# Patient Record
Sex: Female | Born: 1955 | Race: White | Hispanic: No | Marital: Married | Smoking: Never smoker
Health system: Southern US, Community
[De-identification: ages and names within clinical notes are randomized; demographics above are authoritative.]

## PROBLEM LIST (undated history)

## (undated) DIAGNOSIS — N261 Atrophy of kidney (terminal): Secondary | ICD-10-CM

## (undated) DIAGNOSIS — Z87442 Personal history of urinary calculi: Secondary | ICD-10-CM

## (undated) DIAGNOSIS — N182 Chronic kidney disease, stage 2 (mild): Secondary | ICD-10-CM

## (undated) DIAGNOSIS — E781 Pure hyperglyceridemia: Secondary | ICD-10-CM

## (undated) DIAGNOSIS — N201 Calculus of ureter: Secondary | ICD-10-CM

## (undated) DIAGNOSIS — I10 Essential (primary) hypertension: Secondary | ICD-10-CM

## (undated) DIAGNOSIS — E119 Type 2 diabetes mellitus without complications: Secondary | ICD-10-CM

## (undated) DIAGNOSIS — N281 Cyst of kidney, acquired: Secondary | ICD-10-CM

## (undated) DIAGNOSIS — Z8759 Personal history of other complications of pregnancy, childbirth and the puerperium: Secondary | ICD-10-CM

## (undated) DIAGNOSIS — N289 Disorder of kidney and ureter, unspecified: Secondary | ICD-10-CM

## (undated) DIAGNOSIS — N200 Calculus of kidney: Secondary | ICD-10-CM

## (undated) DIAGNOSIS — R3915 Urgency of urination: Secondary | ICD-10-CM

## (undated) HISTORY — DX: Personal history of urinary calculi: Z87.442

## (undated) HISTORY — DX: Essential (primary) hypertension: I10

## (undated) HISTORY — PX: CARDIAC CATHETERIZATION: SHX172

---

## 1986-04-21 DIAGNOSIS — Z8759 Personal history of other complications of pregnancy, childbirth and the puerperium: Secondary | ICD-10-CM

## 1986-04-21 HISTORY — PX: ECTOPIC PREGNANCY SURGERY: SHX613

## 1986-04-21 HISTORY — DX: Personal history of other complications of pregnancy, childbirth and the puerperium: Z87.59

## 2000-11-13 ENCOUNTER — Encounter: Admission: RE | Admit: 2000-11-13 | Discharge: 2000-11-13 | Payer: Self-pay | Admitting: Cardiology

## 2000-11-13 ENCOUNTER — Encounter: Payer: Self-pay | Admitting: Cardiology

## 2000-11-18 ENCOUNTER — Encounter: Admission: RE | Admit: 2000-11-18 | Discharge: 2000-11-18 | Payer: Self-pay | Admitting: Cardiology

## 2000-11-18 ENCOUNTER — Encounter: Payer: Self-pay | Admitting: Cardiology

## 2000-12-09 ENCOUNTER — Ambulatory Visit (HOSPITAL_COMMUNITY): Admission: RE | Admit: 2000-12-09 | Discharge: 2000-12-09 | Payer: Self-pay | Admitting: Cardiology

## 2000-12-09 ENCOUNTER — Encounter: Payer: Self-pay | Admitting: Cardiology

## 2000-12-31 ENCOUNTER — Encounter: Payer: Self-pay | Admitting: Surgery

## 2001-01-04 ENCOUNTER — Encounter: Payer: Self-pay | Admitting: Surgery

## 2001-01-04 ENCOUNTER — Ambulatory Visit (HOSPITAL_COMMUNITY): Admission: RE | Admit: 2001-01-04 | Discharge: 2001-01-05 | Payer: Self-pay | Admitting: Surgery

## 2001-01-04 ENCOUNTER — Encounter (INDEPENDENT_AMBULATORY_CARE_PROVIDER_SITE_OTHER): Payer: Self-pay | Admitting: *Deleted

## 2001-01-04 HISTORY — PX: LAPAROSCOPIC CHOLECYSTECTOMY: SUR755

## 2002-01-28 ENCOUNTER — Ambulatory Visit (HOSPITAL_COMMUNITY): Admission: RE | Admit: 2002-01-28 | Discharge: 2002-01-28 | Payer: Self-pay | Admitting: Cardiology

## 2002-01-28 ENCOUNTER — Encounter: Payer: Self-pay | Admitting: Cardiology

## 2002-02-04 ENCOUNTER — Encounter: Admission: RE | Admit: 2002-02-04 | Discharge: 2002-02-04 | Payer: Self-pay | Admitting: Cardiology

## 2002-02-04 ENCOUNTER — Encounter: Payer: Self-pay | Admitting: Cardiology

## 2002-03-29 ENCOUNTER — Ambulatory Visit (HOSPITAL_BASED_OUTPATIENT_CLINIC_OR_DEPARTMENT_OTHER): Admission: RE | Admit: 2002-03-29 | Discharge: 2002-03-29 | Payer: Self-pay | Admitting: Orthopedic Surgery

## 2002-03-29 HISTORY — PX: SHOULDER ARTHROSCOPY W/ SUBACROMIAL DECOMPRESSION AND DISTAL CLAVICLE EXCISION: SHX2401

## 2002-12-06 ENCOUNTER — Encounter: Payer: Self-pay | Admitting: Nephrology

## 2002-12-06 ENCOUNTER — Encounter: Admission: RE | Admit: 2002-12-06 | Discharge: 2002-12-06 | Payer: Self-pay | Admitting: Nephrology

## 2003-02-15 ENCOUNTER — Encounter: Admission: RE | Admit: 2003-02-15 | Discharge: 2003-02-15 | Payer: Self-pay | Admitting: Cardiology

## 2004-02-16 ENCOUNTER — Encounter: Admission: RE | Admit: 2004-02-16 | Discharge: 2004-02-16 | Payer: Self-pay | Admitting: Cardiology

## 2004-07-09 ENCOUNTER — Ambulatory Visit (HOSPITAL_COMMUNITY): Admission: RE | Admit: 2004-07-09 | Discharge: 2004-07-09 | Payer: Self-pay | Admitting: Cardiology

## 2004-08-27 ENCOUNTER — Ambulatory Visit (HOSPITAL_COMMUNITY): Admission: RE | Admit: 2004-08-27 | Discharge: 2004-08-27 | Payer: Self-pay | Admitting: Neurology

## 2005-02-17 ENCOUNTER — Encounter: Admission: RE | Admit: 2005-02-17 | Discharge: 2005-02-17 | Payer: Self-pay | Admitting: Cardiology

## 2005-04-19 ENCOUNTER — Emergency Department (HOSPITAL_COMMUNITY): Admission: EM | Admit: 2005-04-19 | Discharge: 2005-04-19 | Payer: Self-pay | Admitting: Family Medicine

## 2005-04-20 ENCOUNTER — Inpatient Hospital Stay (HOSPITAL_COMMUNITY): Admission: AD | Admit: 2005-04-20 | Discharge: 2005-04-22 | Payer: Self-pay | Admitting: Nephrology

## 2005-04-21 HISTORY — PX: OTHER SURGICAL HISTORY: SHX169

## 2005-05-19 ENCOUNTER — Ambulatory Visit (HOSPITAL_COMMUNITY): Admission: RE | Admit: 2005-05-19 | Discharge: 2005-05-19 | Payer: Self-pay | Admitting: Urology

## 2005-05-19 HISTORY — PX: EXTRACORPOREAL SHOCK WAVE LITHOTRIPSY: SHX1557

## 2005-05-23 HISTORY — PX: OTHER SURGICAL HISTORY: SHX169

## 2005-05-28 ENCOUNTER — Ambulatory Visit (HOSPITAL_BASED_OUTPATIENT_CLINIC_OR_DEPARTMENT_OTHER): Admission: RE | Admit: 2005-05-28 | Discharge: 2005-05-28 | Payer: Self-pay | Admitting: Urology

## 2006-02-02 HISTORY — PX: CARDIOVASCULAR STRESS TEST: SHX262

## 2006-02-04 ENCOUNTER — Inpatient Hospital Stay (HOSPITAL_BASED_OUTPATIENT_CLINIC_OR_DEPARTMENT_OTHER): Admission: RE | Admit: 2006-02-04 | Discharge: 2006-02-04 | Payer: Self-pay | Admitting: Cardiology

## 2006-02-20 ENCOUNTER — Encounter: Admission: RE | Admit: 2006-02-20 | Discharge: 2006-02-20 | Payer: Self-pay | Admitting: Cardiology

## 2006-05-26 ENCOUNTER — Ambulatory Visit (HOSPITAL_COMMUNITY): Admission: RE | Admit: 2006-05-26 | Discharge: 2006-05-26 | Payer: Self-pay | Admitting: Urology

## 2006-11-25 ENCOUNTER — Ambulatory Visit (HOSPITAL_COMMUNITY): Admission: RE | Admit: 2006-11-25 | Discharge: 2006-11-25 | Payer: Self-pay | Admitting: Obstetrics and Gynecology

## 2006-11-25 ENCOUNTER — Encounter (INDEPENDENT_AMBULATORY_CARE_PROVIDER_SITE_OTHER): Payer: Self-pay | Admitting: Obstetrics and Gynecology

## 2006-11-25 HISTORY — PX: HYSTEROSCOPY W/ ENDOMETRIAL ABLATION: SUR665

## 2006-12-18 ENCOUNTER — Encounter: Admission: RE | Admit: 2006-12-18 | Discharge: 2006-12-18 | Payer: Self-pay | Admitting: Gastroenterology

## 2007-01-28 ENCOUNTER — Ambulatory Visit (HOSPITAL_COMMUNITY): Admission: RE | Admit: 2007-01-28 | Discharge: 2007-01-28 | Payer: Self-pay | Admitting: Gastroenterology

## 2007-02-23 ENCOUNTER — Encounter: Admission: RE | Admit: 2007-02-23 | Discharge: 2007-02-23 | Payer: Self-pay | Admitting: Obstetrics and Gynecology

## 2008-02-24 ENCOUNTER — Encounter: Admission: RE | Admit: 2008-02-24 | Discharge: 2008-02-24 | Payer: Self-pay | Admitting: Obstetrics and Gynecology

## 2009-02-26 ENCOUNTER — Encounter: Admission: RE | Admit: 2009-02-26 | Discharge: 2009-02-26 | Payer: Self-pay | Admitting: Orthopedic Surgery

## 2010-02-27 ENCOUNTER — Encounter: Admission: RE | Admit: 2010-02-27 | Discharge: 2010-02-27 | Payer: Self-pay | Admitting: Obstetrics and Gynecology

## 2010-05-01 ENCOUNTER — Encounter: Payer: Self-pay | Admitting: *Deleted

## 2010-05-06 ENCOUNTER — Ambulatory Visit: Admission: RE | Admit: 2010-05-06 | Discharge: 2010-05-06 | Payer: Self-pay | Source: Home / Self Care

## 2010-05-06 DIAGNOSIS — I10 Essential (primary) hypertension: Secondary | ICD-10-CM | POA: Insufficient documentation

## 2010-05-06 DIAGNOSIS — E781 Pure hyperglyceridemia: Secondary | ICD-10-CM | POA: Insufficient documentation

## 2010-05-06 DIAGNOSIS — E119 Type 2 diabetes mellitus without complications: Secondary | ICD-10-CM | POA: Insufficient documentation

## 2010-05-23 NOTE — Miscellaneous (Signed)
Summary: link to wellness  Clinical Lists Changes LM on her home phone to call. needs appt with Dr. Gerilyn Pilgrim- 05/28/10 is open. will need 1 hr appt.Golden Circle RN  May 01, 2010 12:16 PM

## 2010-05-23 NOTE — Assessment & Plan Note (Signed)
Summary: new pt/pre dm/elevated A1C/also getting a lot of kidney stone...   Vital Signs:  Patient profile:   55 year old female Height:      64.5 inches Weight:      173.8 pounds BMI:     29.48  Vitals Entered By: Wyona Almas PHD (May 06, 2010 9:11 AM)  History of Present Illness: Assessment:  Spent 60 min w/ pt.  Cicely was referred by Crisoforo Oxford to Wellness.  She works 12-hr shifts at [employer], Mon, & Tue, so sleep adequacy is an issue.  She was diagnosed with pre-DM in Nov 2010, and had an A1C of 7.8 in Nov 2011.  FBG have been running 98-130s, which have improved since starting the Glimeperide in Dec.  Eating pattern is erratic.  Everyday foods/beverages include diet Mtn Dew, unswtned tea, V-8 juice.  Usual exercise routine is none.  24-hr recall suggests intake of <1000 kcal: (Up at 11:30 AM) B (1:30 PM)- 1 1/2 c spaghetti sauce w/  ~1 c noodles, unswt tea; (nap 4-5:30 PM; to work at 7 PM); Snk (7 PM)- diet Mtn Dew; L (10:30 PM)- 6" Ital sub w/ ham, salami, cheese, let, pep, oil & vinegar, chips; D (2:30 PM)- ditto sandwich, diet Mtn Dew.  Jaiya said she usually eats more veg's.  Med's include Glimeperide and Metformin, Carvedilol and Cozaar-K for HTN, and lasix prn elevated BP, and fenofibrate for elevated TG, 80 mg ASA.  Other diagnoses include hypertension (since age 33), elevated TG, and she has only one functional kidney, with a h/o multiple kidney stones.    Nutrition Diagnosis:  Physical inactivity (NB-2.1) related to poor motivation as evidenced by pt report of no exercise.  Inappropriate intake of types of carbohydrate (NI-53.3) related to veg's as evidenced by usual intake of no more than 1 x day and excessive reliance on starchy foods such as tomato sauce, corn, &  peas.    Intervention: See Patient Instructions.    Monitoring/Eval:  Dietary intake, body weight, and exercise at 3-4-wk F/U.     Other Orders: Inital Assessment Each - [facility] [id number])  Patient  Instructions: 1)  This is the time to take care of YOU.  This means figuring out a schedule of rest and sleeping that provides you with teh energy to do all that "taking care" means, including exercise, food planning, etc.   2)  Ask your doctor to check your vitamin D levels.   3)  I would like to see you focus on normalizing your blood sugars, but ALSO your blood pressure.  (Google DASH diet = lots of fruits & veg's; low- or nonfat dairy; nnuts & seeds; low in red meats and fatty foods.)  Recommended is <1500 mg Na/day.   4)  Diabetes dietary principle: Consistent & evenly distributed calories thru the day.  Suggestion: USE THE FORMS YOU MADE FOR YOUR MOM TO TRACK YOUR OWN INTAKE & TIMING OF EATING.   5)  Obtain twice as many veg's as protein or carbohydrate foods for both lunch and dinner. 6)  Limit starch foods to no more than 2 servings per meal.  ONE serving = 1 pc of bread, 1/2 cup of scoopable starch food, 1 oz of a baked item; 1 starch food = 15 g carb.  7)  Caution:  Corn is a starch, and tomato sauces can increase blood sugar quite effectively.   8)  10 minutes of any kind of exercise every day!  AND track # of minutes  you exercise on your food record.  Bring this to your follow-up appt, in 3-4 wks.  PLEASE TELL THEM LINK TO WELLNESS AS YOU SCHEDULE.     Orders Added: 1)  Inital Assessment Each - [facility] [[id number]]

## 2010-06-06 ENCOUNTER — Ambulatory Visit (INDEPENDENT_AMBULATORY_CARE_PROVIDER_SITE_OTHER): Payer: Commercial Managed Care - PPO | Admitting: Family Medicine

## 2010-06-06 DIAGNOSIS — E119 Type 2 diabetes mellitus without complications: Secondary | ICD-10-CM

## 2010-06-06 NOTE — Patient Instructions (Addendum)
-   Daily food record, including what, how much, and what time food is eaten.   - When you get a very low or very high BG reading, look back to what you last ate and how much, as well as when you last ate.  (Write it down.)   - When you feel hungry, EAT.  Bring snacks to work, such as string cheese, fruit, yogurt, protein bar.  Pay attention to your hunger.   - Include a substantial amount of protein at each meal, and when possible, include some protein with snacks.   - Don't be afraid of all carb's, but be selective, i.e., choose carb's with fiber or with natural sources of sugar.  Remember that you NEED some carb in the morning after fasting.   - Work on incorporating some walking to your routine.  Goal:  At least 15 min 2 X day, 5 X wk.  Write # of minutes on your food record.   - Record BG on food records as well.   - Please schedule a follow-up nutrition appt for about a month from now.

## 2010-06-06 NOTE — Progress Notes (Signed)
Medical Nutrition Therapy:  Appt start time: 0830 end time:  0900.  Assessment:  Primary concerns today: Weight management and Blood sugar control. Madison Gill works third shift.  She said she did well bringing food to work or was eating salads at the cafeteria at [facility].  FBG have been 80-90, and other BG have been lower as well.  Did not do a 24-hr recall b/c yesterday was atypical, although Madison Gill said her eating pattern is erratic, as she's been very busy (as usual).   She has been walking at least 15 min 2-4 X day b/c of parking far away at work.  She still hopes to get a routine for walking at home as well.   Usual eating pattern has been erratic, although less so when she was doing day shift for a couple wks recently. Madison Gill is back to her usual third shift now.    Progress Towards Goal(s):  In progress   Nutritional Diagnosis:  NB-2.1 Physical inactivity As related to time constraints.  As evidenced by no dedicated exercise time. NI-5.8.3 Inappropriate intake of types of carbohydrates (specify):  As related to vegetables.  As evidenced by self-report of some, but not frequent intake of veg's.    Intervention:  Nutrition education.   Monitoring/Evaluation:  Dietary intake in 1 month.

## 2010-07-04 ENCOUNTER — Ambulatory Visit (INDEPENDENT_AMBULATORY_CARE_PROVIDER_SITE_OTHER): Payer: Commercial Managed Care - PPO | Admitting: Family Medicine

## 2010-07-04 DIAGNOSIS — E119 Type 2 diabetes mellitus without complications: Secondary | ICD-10-CM

## 2010-07-04 NOTE — Progress Notes (Signed)
Medical Nutrition Therapy:  Appt start time: 0900 end time:  0930.  Assessment:  Primary concerns today: Blood sugar control. Madison Gill has been making better food choices, although she has not been able to work exercise into her routine yet.  Recent A1C was much improved, 6.3.  Madison Gill's sleep has not improved; third shift work hours in addition to time commitments (and worry) related to care of her mother make this difficult.  She is eating more often, including 3 meals and at least 2 snacks daily.  She has also been keeping a food record, has been bringing veg's to work, and has consistently been getting some protein with meals.  FBGs have been 80-115.  No 24-hr recall today b/c yesterday was an atypical day (catching up on sleep).   Progress Towards Goal(s):  Some progress.   Nutritional Diagnosis:  NB-2.1 Physical inactivity As related to time constraints.  As evidenced by no dedicated exercise time. Much improved on inappropriate carbohydrate intake as evidenced by A1C of 6.3.      Intervention:  Nutrition education.   Monitoring/Evaluation:  Dietary intake in 1 month.

## 2010-07-04 NOTE — Patient Instructions (Signed)
-   Keep up the great job you've been doing with food choices.   - Physical activity goals:  1. Track # minutes you do ANY physical activity (includes gardening, significant housework).  2. Walk at least 5 minutes daily.   3. Exercise opportunities:  Never lie down when you can sit; never sit when you can stand; never      stand when you can pace.  Also includes parking far away; stairs vs elevator.   - SLEEP!  Do your best to get more.

## 2010-08-05 ENCOUNTER — Ambulatory Visit: Payer: Commercial Managed Care - PPO | Admitting: Family Medicine

## 2010-08-12 ENCOUNTER — Ambulatory Visit (INDEPENDENT_AMBULATORY_CARE_PROVIDER_SITE_OTHER): Payer: 59 | Admitting: Family Medicine

## 2010-08-12 DIAGNOSIS — E119 Type 2 diabetes mellitus without complications: Secondary | ICD-10-CM

## 2010-08-12 NOTE — Patient Instructions (Addendum)
-   Continue keeping food record and fasting BG, but add times for eating and BG checks.   - One goal is to have pretty evenly distributed calories thru the day.   - Aim for vegetables twice a day.  When you have pizza, ALWAYS include a salad (or any other veg).  If no veg, then have a fruit.   - Try to increase your physical activity, and continue to record exercise in your notebook.

## 2010-08-12 NOTE — Progress Notes (Signed)
Medical Nutrition Therapy:  Appt start time: 0900 end time:  0930.  Assessment:  Primary concerns today: Blood sugar control. Madison Gill's food record suggests limited kcal intake and careful CHO control on most days.  Some meals do not include veg's, although she has been eating a lot of bean & veg soup she has been making.  She had one hypoglycemic event on Mar 17, after which she called Dr. Doristine Counter, who said to discontinue PM Amaryl dose.  FBG went up initially after this, but most are back in the 100s range or lower.  30-day average is 111.  She has played doubles  tennis twice and has been walking 10-20 min 4-5 X wk.  It is surprising that Madison Gill's wt is up today (6 lb) in light of her food record and at least minimal exercise.    Progress Towards Goal(s):  Some progress.   Nutritional Diagnosis:  NB-2.1 Physical inactivity As related to time constraints.  As evidenced by 10-20 min walking 4-5 X wk. Much improved on inappropriate carbohydrate intake as evidenced by A1C of 6.3.      Intervention:  Nutrition education.   Monitoring/Evaluation:  Dietary intake in 1 month.

## 2010-09-03 NOTE — Op Note (Signed)
NAMEADDILYN, Madison Gill                  ACCOUNT NO.:  [id number]   MEDICAL RECORD NO.:  [id number]          PATIENT TYPE:  AMB   LOCATION:  SDC                           FACILITY:  [facility]   PHYSICIAN:  Malva Limes, M.D.    DATE OF BIRTH:  07-03-1955   DATE OF PROCEDURE:  11/25/2006  DATE OF DISCHARGE:                               OPERATIVE REPORT   PREOPERATIVE DIAGNOSIS:  Menorrhagia.   POSTOPERATIVE DIAGNOSIS:  Menorrhagia.   PROCEDURES:  1. Dilation and curettage.  2. NovaSure endometrial ablation.   SURGEON:  Malva Limes, M.D.   ANESTHESIA:  General.   ANTIBIOTICS:  Ancef 1 g.   DRAINS:  Catheter to the bladder.   ESTIMATED BLOOD LOSS:  Minimal.   SPECIMENS:  Endometrial curettings to pathology.   COMPLICATIONS:  None.   PROCEDURE:  The patient was taken to the operating room, where a general  anesthetic was administered without complications.  She was then placed  in the dorsal lithotomy position.  She was prepped with Betadine and  draped in the usual fashion for this procedure.  An exam under  anesthesia revealed an anteverted uterus of normal size and shape. A  weighted speculum was placed in the vagina, 20 mL of 1% lidocaine were  used for a paracervical block.  A single-tooth tenaculum was applied to  the anterior cervical lip.  The uterus was sounded to 10 cm.  The cervix  was serially dilated to a 31 [place].  Sharp curettage was performed.  Next the cervical length was measured to 4 cm, giving an intracavitary  length of 6 cm.  The NovaSure device was advanced into the fundus of the  uterus and opened to a width of 4 cm.  At this point the NovaSure  ablation was performed without difficulty.  The patient was then  extubated and taken to the recovery room in stable condition.  She will  be discharged to home with Percocet to take p.r.n.  She will follow up  in the office in 4 weeks.           ______________________________  Malva Limes, M.D.    MA/MEDQ   D:  11/25/2006  T:  11/25/2006  Job:  [id number]

## 2010-09-03 NOTE — Op Note (Signed)
NAME:  Madison Gill, Madison Gill                  ACCOUNT NO.:  [id number]   MEDICAL RECORD NO.:  [id number]          PATIENT TYPE:  AMB   LOCATION:  ENDO                         [facility]ACILITY:  [facility]   PHYSICIAN:  James L. Malon Kindle., M.D.DATE OF BIRTH:  05/31/1955   DATE OF PROCEDURE:  01/28/2007  DATE OF DISCHARGE:                               OPERATIVE REPORT   PROCEDURE:  Colonoscopy.   MEDICATIONS:  Fentanyl 62.5 mcg, Versed 7 mg IV.   SCOPE:  Pentax adult scope.   INDICATIONS:  Colon cancer screening in a woman whose father had colon  polyps.   DESCRIPTION OF PROCEDURE:  Procedure explained to the patient, consent  obtained.  With the patient in left lateral decubitus position, the  scope was inserted and advanced __________  were able to reach the cecum  without difficulty.  Ileocecal valve and appendiceal orifice seen.  Scope withdrawn.  Cecum, ascending, transverse, descending and sigmoid  colon seen well.  No polyps.  Rectum free of polyps.  The scope was  withdrawn.  The patient tolerated the procedure well.   ASSESSMENT:  Family history of colon polyps in her father with no  abnormalities found on colonoscopy.   PLAN:  Routine post-colonoscopy instructions.  Will recommend repeating  her colonoscopy in 5 years.           ______________________________  Llana Aliment Malon Kindle., M.D.     Waldron Session  D:  01/28/2007  T:  01/29/2007  Job:  [id number]   cc:   Cassell Clement, M.D.  Fax: [phone]   Dyke Maes, M.D.  Fax: [phone]   Llana Aliment. Malon Kindle., M.D.  Fax: [phone]

## 2010-09-06 NOTE — Discharge Summary (Signed)
Madison Gill, Madison Gill                  ACCOUNT NO.:  [id number]   MEDICAL RECORD NO.:  [id number]          PATIENT TYPE:  INP   LOCATION:  5709                         FACILITY:  [facility]   PHYSICIAN:  Aram Beecham B. Eliott Nine, M.D.DATE OF BIRTH:  1955/11/16   DATE OF ADMISSION:  04/20/2005  DATE OF DISCHARGE:  04/22/2005                                 DISCHARGE SUMMARY   ADMISSION DIAGNOSIS:  Acute renal failure secondary to obstruction of a  solitary functioning kidney.   DISCHARGE DIAGNOSES:  1.  Obstructive uropathy with acute renal failure, status post laser      ablation and double-J stent placement.  2.  Anemia.  3.  Hypertension.   DISCHARGE MEDICATIONS:  1.  Coreg 25 mg b.i.d.  2.  HCTZ 12.5 mg daily.   PROCEDURE:  April 21, 2005 - Cystoscopy with left retrograde pyelogram,  ureteroscopy, Holmium laser of a left ureteropelvic junction stone and  insertion of a double-J catheter, done at [facility]  under general anesthesia.   HISTORY OF PRESENT ILLNESS:  Ms. 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 She had a prior history of parenchymal kidney stones  by prior MRI and CT of the abdomen but had never been symptomatic from  stones. She presented to the emergency department with 3 days of left flank  pain and inability to void. On admission to the hospital, serum creatinine  was 2.3 and CT scan showed left sided hydronephrosis with a ureterovesical  stone and a 5 mm stone at the ureterovesical junction. There were other  intra-renal stones as well.   PAST MEDICAL HISTORY/SOCIAL HISTORY/ALLERGIES/REVIEW OF SYSTEMS:  Are as in  the dictated H&P.   PHYSICAL EXAMINATION:  On admission was notable for a blood pressure of  139/88, left CVA tenderness and a serum creatinine of 2.3.   LABORATORY DATA:  CT scan is as noted above.   HOSPITAL COURSE:  Because of the acute  hydronephrosis and a solitary  functioning kidney, urology was consulted. Dr. Brunilda Payor consulted on the patient  and took her to [facility] on April 21, 2005, where a  left retrograde pyelogram, ureteroscopy, and Holmium laser was done for the  left UPJ stone. A double-J catheter was inserted. She immediately had 700 cc  of bloody urine output. She continued to make good urine. Her creatinine  peaked at 5.7 prior to the procedure, was down to 3.6 the afternoon  afterward, and was 2.1 on the day of discharge. She also experienced a drop  in hemoglobin, most likely related to the procedure and ongoing hematuria.  Hemoglobin dropped from 13.6 on admission to 10.3 on the day of discharge.   She demonstrated marked clinical improvement, excellent urine output, no  continued blood urine. There were no complications relevant to the  procedure.   DISPOSITION:  The patient will be discharged home on April 22, 2005.   SPECIAL INSTRUCTIONS:  She will continue Coreg and HCTZ but Diovan, which  was held during her hospitalization will remain on hold.   FOLLOW UP:  She will return to [facility] on April 24, 2005 for a  blood pressure check and followup labs to include a CBC and renal profile.  Weight and blood pressure will also be done at that time and she will  receive a followup appointment to see Dr. Briant Cedar and instructions on  whether or not to restart her Diovan. Dr. Brunilda Payor will contact her with a  followup appointment to pull the double-J stent in approximately 1 month and  he plans to do a stone evaluation at that time as well. She will be  instructed to start oral iron for her anemia.   DISCHARGE MEDICATIONS:  1.  Coreg 25 mg b.i.d.  2.  HCTZ 12.5 mg daily.  3.  Ferrous sulfate 325 mg t.i.d.  4.  Diovan will remain on HOLD, pending further instructions.           ______________________________  Duke Salvia Eliott Nine, M.D.     CBD/MEDQ  D:  04/22/2005  T:   04/22/2005  Job:  [id number]   cc:   Lindaann Slough, M.D.  Fax: [phone]   Dyke Maes, M.D.  Fax: [phone]   [facility]

## 2010-09-06 NOTE — H&P (Signed)
Madison Gill, Madison Gill                  ACCOUNT NO.:  [id number]   MEDICAL RECORD NO.:  [id number]          PATIENT TYPE:  OUT   LOCATION:  DAY                          FACILITY:  [facility]   PHYSICIAN:  Maree Krabbe, M.D.DATE OF BIRTH:  Mar 20, 1956   DATE OF ADMISSION:  04/21/2005  DATE OF DISCHARGE:                                HISTORY & PHYSICAL   REASON FOR ADMISSION:  Elevated creatinine and no urine output.   HISTORY:  This is a 55 year old female with history of hypertension followed  by Dr. Briant Cedar on three blood pressure medications.  She has no renal  disease, except she does have a solitary renal function and a left to non-  functioning right kidney.  She also has a history of intrarenal stones by  previous MRI and CT of the abdomen.  She has never had a symptomatic  ureteral stone.  Over the last three days, she has developed left flank pain  and inability to void.  She presented to the emergency room last night where  we drew some laboratories, gave her fluids and pain medicines.  She had pain  relief, but the creatinine returned to 2.3 and I am admitting her today to  look for hydronephrosis because she still has not voided.  The CT scan we  just did as part of her admission work-up shows a left-sided hydronephrotic  kidney with a ureteral vesicular stone and a 5 mm stone at the  ureterovesical junction and some intrarenal stones also.   PAST MEDICAL HISTORY:  1.  Hypertension.  2.  Cholecystectomy.  3.  Ectopic pregnancy.  4.  Shoulder surgery.  5.  Cesarean section.   HOME MEDICATIONS:  1.  Coreg 25 b.i.d.  2.  HCTZ 12.5 daily.  3.  Diovan 320 b.i.d.   SOCIAL HISTORY:  No tobacco or alcohol.  She is married, lives with her  husband.  Two children 46 and 55 year old.  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   ALLERGIES:  None.   REVIEW OF SYSTEMS:  No fevers, chills, sweats.  ENT:  No hearing loss,  visual change.  CARDIORESPIRATORY:  No cough,  shortness of breath, chest  pain.  GI:  Positive for nausea, vomiting, left flank pain.  No diarrhea.  GU:  She had a small amount of gross hematuria, but otherwise had not voided  in three days.  MUSCULOSKELETAL:  No myalgia, arthralgia.  No focal  numbness, weakness.   PHYSICAL EXAMINATION:  VITAL SIGNS:  139/88 blood pressure, heart rate 80,  respirations 12.  Patient afebrile.  SKIN:  Warm and dry.  HEENT:  PERRL.  EOMI.  Throat clear.  NECK:  Supple without JVD.  CHEST:  Clear throughout.  CARDIAC:  Regular rate and rhythm without murmurs, rubs, or gallops.  ABDOMEN:  Soft, nontender.  No CVA tenderness today.  In the emergency room  last night she had marked left CVA tenderness.  EXTREMITIES:  No peripheral edema.  Good peripheral pulses.  NEUROLOGIC:  Nonfocal motor examination.  Alert and oriented x3 in  no  distress.   LABORATORIES:  Creatinine 2.3 from laboratories last night.  CT scan results  as above.   IMPRESSION:  1.  Acute renal failure in a patient with a solitary left kidney and normal      baseline function according to her currently with symptoms of left      ureterolithiasis and a history of known renal stones.  CT scan does show      an obstructing stone with left-sided hydronephrosis and we will need      urology consults.  Will decrease intravenous fluids to avoid fluid      overload.  Patient will probably need a stent.  I have not started      antibiotics.  This might be something to consider also.  She has not had      any fever.  There is no urine on I&O catheterization.  2.  Hypertension, long-standing.  Will hold Diovan, hydrochlorothiazide.      Continue Coreg for now.      Maree Krabbe, M.D.  Electronically Signed     RDS/MEDQ  D:  04/20/2005  T:  04/21/2005  Job:  [id number]

## 2010-09-06 NOTE — Op Note (Signed)
[place]. [facility]  Patient:    Madison Gill, Madison Gill. Visit Number: [id number] MRN: [id number]          Service Type: Attending:  Sandria Bales. Ezzard Gill, M.D. Dictated by:   Madison Gill, M.D. Proc. Date: 01/04/01   CC:         Madison Gill, M.D.   Operative Report  DATE OF BIRTH:  04-Apr-1956.  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  PROCEDURE:  Laparoscopic cholecystectomy with intraoperative cholangiogram.  SURGEON:  Madison Gill, M.D.  FIRST ASSISTANT:  Madison Gill, M.D.  ANESTHESIA:  General endotracheal.  ESTIMATED BLOOD LOSS:  Minimal.  INDICATION FOR PROCEDURE:  Madison Gill is a 55 year old white female who comes with symptomatic cholelithiasis.  She also by CT scan has a mesenteric cyst on the greater curvature of the stomach that I will try to identify at the time of the procedure.  The patient has a nonfunctioning right kidney.  She has been evaluated and is hypertensive.  Her primary doctor is Dr. Cassell Gill.  DESCRIPTION OF PROCEDURE:  Patient placed in a supine position.  She was given 1 g of Ancef at the initiation of the procedure.  She underwent a general endotracheal anesthetic.  Her abdomen was prepped with Betadine solution.  She had PAS stockings in place.  An infraumbilical incision was made, with sharp dissection carried down to the abdominal cavity.  A 0 degree, 10 mm laparoscope was inserted through a 12 mm Hasson trocar.  The Hasson trocar was secured with a 0 Vicryl suture and the abdomen insufflated with CO2 to about 4 L.  Both lobes of the liver were evaluated.  There was no mass or nodule.  There was no obvious mass along the greater curvature of the  stomach.  She had some adhesions in the lower abdomen from a prior C-section.  Three different trocars were placed, a 10 mm Ethicon trocar in the subxiphoid location, a 5 mm Ethicon trocar in the right subcostal location, and a 5 mm Ethicon trocar in the right lateral subcostal location.  Each trocar site was infiltrated with 0.25% Marcaine.  The gallbladder was grasped, rotated cephalad.  The gallbladder was noted to be deeply invested in fibrous scar tissue, which actually tethered the duodenum against the gallbladder wall.  This was taken down both with blunt dissection and sharp dissection using hook Bovie coagulation.  An anterior and posterior branch of the cystic artery were identified.  The anterior branch was triply endoclipped and the posterior branch doubly endoclipped.  The cystic duct was identified and was somewhat kinked but then straightened out, and an intraoperative cholangiogram was obtained.  A clip was placed on the gallbladder side of the cystic duct, an incision made in the side of the cystic duct.  The intraoperative cholangiogram was obtained using a cut-off Taut catheter inserted through a 14-gauge Jelco catheter into the abdominal cavity.  The cystic duct was cut.  There were at least three impacted gallstones, which were milked back.  This then did allow free flow of bile out of the cystic duct.  A catheter was then placed into the cystic duct and intraoperative cholangiogram then obtained using half-strength Hypaque solution, using about 8 cc under direct fluoroscopy, showing free flow of contrast down the  cystic duct to the common bile duct, which was small, and into the duodenum.  There was no obstruction, no mass, no filling defect, and this was felt to be a normal intraoperative cholangiogram.  The Taut catheter was then removed, the cystic duct triply endoclipped and divided.  The gallbladder was then sharply and bluntly dissected, divided from the  gallbladder bed using primarily hook Bovie coagulation.  Prior to complete division of the gallbladder from the gallbladder bed, the gallbladder bed was visualized, and there was no bleeding.  The triangle of Calot was visualized, and there was no bleeding or bile leak from this.  The gallbladder was then divided, placed in an Endocatch bag, and delivered through the umbilicus.  I then irrigated out the abdomen with about a liter of saline.  I then looked for this mesenteric cyst along the greater curvature of the stomach.  Using a [place] and [place] and [place], I was able to walk along the edge of the greater curvature all the way up to the spleen and could see no obvious mesenteric cyst, mass, or nodularity.  There was no other suspicious lesion identified.  I thought I got a pretty good view of the greater curvature of the stomach, though the _____ could have been hidden in the fat.  I was somewhat leery about trying to pull the stomach down too much for tearing the spleen, and I was not aggressive at the upper end of the stomach to look for this.  Therefore, I stopped the procedure at this time with plan for repeat CT scan in six months to make sure this cyst is stable as originally recommended.  Each trocar was removed under direct visualization. The umbilical trocar site was closed with a 0 Vicryl suture.  The other trocar sites were just irrigated, skin closed at each trocar site with a 5-0 Vicryl suture, painted with tincture of benzoin and Steri-Strips and sterilely dressed.  The patient tolerated the procedure well, was transported to the recovery room in good condition.  Sponge and needle count were correct at the end of the case. Dictated by:   Madison Gill, M.D. Attending:  Sandria Bales. Ezzard Gill, M.D. DD:  01/04/01 TD:  01/04/01 Job: [id number]

## 2010-09-06 NOTE — H&P (Signed)
NAME:  Madison Gill, Madison Gill                  ACCOUNT NO.:  [id number]   MEDICAL RECORD NO.:  [id number]          PATIENT TYPE:  OIB   LOCATION:  NA                           FACILITY:  [facility]   PHYSICIAN:  Colleen Can. Deborah Chalk, M.D.DATE OF BIRTH:  06-06-1955   DATE OF ADMISSION:  DATE OF DISCHARGE:                                HISTORY & PHYSICAL   CHIEF COMPLAINT:  Chest pain.   HISTORY OF PRESENT ILLNESS:  The patient is a very pleasant 55 year old  white female who is referred for elective diagnostic cardiac  catheterization.  She was seen in the office as a work-in appointment on  October 10 with multiple somatic complaints.  She has had chest pain and  significant fatigue and basically had not felt well over the course of the  previous weekend.  She describes an indigestion and burning-like sensation  that goes up into her chest.  She really has not wanted to eat and she is  very tired.  She has not been sleeping.  Her blood pressure was noted to be  grossly elevated.  She denied any significant stress and actually thought  that she had been going through menopause.  Her menstrual cycle had been  very heavy with significant menorrhagia.  She was subsequently referred for  stress Cardiolite study which was performed by Dr. Patty Sermons on October 15.  With this, she exercised well on a standard Bruce protocol.  She did not  have chest pain.  There were no EKG changes; however, she was noted to have  a possible reversible anterior defect with ischemia versus breast  attenuation. She is now referred for diagnostic catheterization.   PAST MEDICAL HISTORY:  1. Hypertension, recently uncontrolled.  2. Atypical chest pain.  3. History of only 1 functioning kidney followed by Dr. Fidela Salisbury.  4. Questionable menopausal symptoms.  5. History of depression.  6. Previous cholecystectomy in 2002.  7. History of nephrolithiasis.  8. Known history of diffuse right renal artery stenosis in the  setting of      a small right kidney with possible history of polycystic kidney      disease.  9. Past history of tachycardia.  10.ACE-related cough.  11.Prior history of UTIs.   ALLERGIES:  1. ALTACE WHICH CAUSES A COUGH.  2. TOPROL RESULTS IN FEELING OF FATIGUE.   CURRENT MEDICATIONS:  1. Micardis HCT 80/12.5 daily.  2. Wellbutrin 300 mg a day.  3. Coreg 12.5 mg 2 tablets in the morning and 1 at bedtime.  4. Norvasc 5 mg a day.  5. Baby aspirin daily.  6. Nitroglycerin p.r.n.  7. Prevacid 30 mg a day.   FAMILY HISTORY:  Positive for hypertension and diabetes.   SOCIAL HISTORY:  She is married.  She has no alcohol or tobacco use.  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   REVIEW OF SYSTEMS:  Basically as noted above.  She continues to have chest  pain off and on.  It is really nonexertional in nature.  It is described  as  a dully, achy like sensation.  She has had associated headache and nausea.  She continues to have trouble sleeping.   PHYSICAL EXAMINATION:  VITAL SIGNS:  Her weight is 179, blood pressure is  140/80 sitting and 120/80 standing, heart rate is 80 and regular,  respirations 18.  She is afebrile.  SKIN:  Warm and dry.  Color is unremarkable.  LUNGS:  Clear.  HEART:  Regular rhythm.  ABDOMEN:  Soft, positive bowel sounds, nontender.  EXTREMITIES:  Without edema.  NEUROLOGIC:  No gross focal deficits.   PERTINENT LABS:  Pending.   OVERALL IMPRESSION:  1. Chest pain in the setting of an abnormal stress Cardiolite study.  2. Hypertension.  3. History of diffuse right renal artery stenosis with a small atrophic      right kidney and possible polycystic kidney disease.   PLAN:  Will proceed on with diagnostic catheterization.  Procedure has been  reviewed in full detail and she is willing to proceed on Wednesday, February 04, 2006.      Sharlee Blew, N.P.      Colleen Can. Deborah Chalk, M.D.  Electronically  Signed    LC/MEDQ  D:  02/03/2006  T:  02/04/2006  Job:  [id number]   cc:   Dyke Maes, M.D.

## 2010-09-06 NOTE — Op Note (Signed)
NAMEVENBA, ZENNER                  ACCOUNT NO.:  [id number]   MEDICAL RECORD NO.:  [id number]          PATIENT TYPE:  AMB   LOCATION:  [facility]                         FACILITY:  [facility]   PHYSICIAN:  Lindaann Slough, M.D.  DATE OF BIRTH:  [birth year]   DATE OF PROCEDURE:  05/28/2005  DATE OF DISCHARGE:                                 OPERATIVE REPORT   PREOPERATIVE DIAGNOSIS:  Left ureteral stones.   POSTOPERATIVE DIAGNOSIS:  Left ureteral stones.   PROCEDURE:  Cystoscopy, ureteroscopy with stone extraction and insertion of  double-J catheter.   SURGEON:  Danae Chen, M.D.   ANESTHESIA:  General.   INDICATIONS:  The patient is a 55 years old female who had holmium laser of  left UPJ stone on April 21, 2005 then ESL of left renal stone on May 19, 2005. The double-J catheter was removed 2 days ago. She is known to have  an atrophic right kidney. She was doing well until last night when she  started having severe left flank pain. She went to see Dr. Briant Cedar today.  Her creatinine went up to 2.3. Dr. Briant Cedar called me and I asked him to  send her to the office. KUB showed multiple stone fragments in the left  distal ureter. She is scheduled for cystoscopy and stone extraction.   Under general anesthesia, the patient was prepped and draped and placed in  the dorsolithotomy position. A #22 Wappler cystoscope was inserted in the  bladder and a sensor guidewire was passed through the cystoscope into the  left ureter all the way up into the renal pelvis. Then a #6.5 [place] short  ureteroscope was passed in the bladder and into the ureter. There are  multiple stone fragments  in the distal ureter. Those stone fragments were  broken down in smaller fragments and with the nitinol basket those stone  fragments were extracted. There was no evidence of ureteral injury and there  was no evidence of remaining stone fragments in the ureter. The ureteroscope  was advanced without difficulty  up to the upper ureter. The ureteroscope was  then removed. The guidewire was then backloaded into the cystoscope and a #6-  [place] - 24 Polaris double-J catheter was passed over the guidewire. The  proximal curl of the double-J catheter is in the renal pelvis and the loops  are in the bladder. The bladder was then irrigated and all stone fragments  were irrigated out of the bladder. The bladder was then emptied and the  cystoscope and guidewire were removed.   The patient tolerated the procedure well and left the OR in satisfactory  condition to post anesthesia care unit.      Lindaann Slough, M.D.  Electronically Signed     MN/MEDQ  D:  05/28/2005  T:  05/29/2005  Job:  [id number]

## 2010-09-06 NOTE — Op Note (Signed)
NAME:  Madison Gill, Madison Gill                            ACCOUNT NO.:  [id number]   MEDICAL RECORD NO.:  [id number]                   PATIENT TYPE:  AMB   LOCATION:  [facility]SC                                  FACILITY:  [facility]   PHYSICIAN:  Vania Rea. Supple, M.D.               DATE OF BIRTH:  Oct 30, 1955   DATE OF PROCEDURE:  03/29/2002  DATE OF DISCHARGE:                                 OPERATIVE REPORT   PREOPERATIVE DIAGNOSIS:  1. Left shoulder impingement syndrome.  2. Left shoulder acromioclavicular joint arthropathy.   POSTOPERATIVE DIAGNOSIS:  1. Left shoulder impingement syndrome.  2. Left shoulder acromioclavicular joint arthropathy.  3. Degenerative tear of the superior labrum (type 1 SLAP lesion).   OPERATION PERFORMED:  1. Left shoulder examination under anesthesia.  2. Left shoulder glenohumeral joint diagnostic arthroscopy.  3. Debridement of degenerative type 1 SLAP lesion.  4. Arthroscopic subacromial decompression and bursectomy.  5. Arthroscopic distal clavicle resection.   SURGEON:  Vania Rea. Supple, M.D.   ANESTHESIA:  General endotracheal as well as preoperative interscalene  block.   ESTIMATED BLOOD LOSS:  Minimal.   DRAINS:  None.   INDICATIONS FOR PROCEDURE:  The patient is a 55 year old female who has had  persistent left shoulder pain and weakness and limitations in motion with  clinical examination showing a painful arc of motion and positive  impingement sign with preoperative MRI scan confirming some tendinosis of  the rotator cuff as well as marked edema and inflammation surrounding the AC  joint.  Due to ongoing pain and functional limitation, she is brought to the  operating room at this time for planned left shoulder arthroscopy as  described below.   Preoperatively, she was counseled on treatment options as well as the risks  versus benefits and therapeutic possibles, surgical complications including  but not limited to neurovascular injury, persistence  of pain, loss motion  and possible need for additional surgery were reviewed.  She understands and  accepts and agrees the outlined  procedure.   DESCRIPTION OF PROCEDURE:  After undergoing routine preoperative evaluation,  the patient received prophylactic antibiotics.  Had an interscalene  established in the  holding area by the anesthesia department.  Placed  supine on the operating table.  Underwent smooth induction of general  endotracheal anesthesia.  Turned to the right lateral decubitus position on  the beanbag and appropriately padded and protected.  Left arm was suspended  in 70/30 position with 10 pounds of traction.  Examination under anesthesia  showed no obvious glenohumeral instability.  The left shoulder girdle region  was sterilely prepped and draped in standard fashion.  Standard posterior  portals were established, anterior portal established under direct  visualization.  Glenohumeral articular surfaces were in good condition.  Rotator cuff was carefully inspected and noted to be in excellent condition  with no obvious fraying, tearing or degeneration.  There was  extensive  degenerative fraying and tearing of the superior labrum consistent with type  1 slap lesion and this was debrided with a shaver to a stable peripheral  rim.  The remaining inspection of the glenohumeral joint showed no obvious  additional intraarticular pathology.  The fluid and instruments were  removed.  The arm was dropped down to 30 degrees abduction with the  arthroscope introduced into the subacromial space in the posterior portal  and direct lateral portal in the subacromial space.  Abundant proliferative  bursal tissue was excised with a combination of shaver and Arthrex wand.  The wand was used to remove the periosteum from the inner surface of the  anterior half of the acromion.  A bur was then used to perform a subacromial  decompression creating type 1 morphology.  The CA ligament was  divided and  resected distally.  A portal was then established directly anterior to the  distal clavicle and the distal clavicle resection was performed to the bur.  Care was taken to make sure that the entire circumference of the distal  clavicle could be visualized to ensure adequate removal of bone.  We then  completed the subacromial bursectomy and carefully inspected the bursal  surface of the rotator cuff and saw no obvious additional tearing or  degeneration.  Final hemostasis was obtained.  Fluid and instruments were  removed. The lateral portal was closed with Monocryl and all other portals  were closed with Steri-Strip.  Bulky dry dressing was then taped over the  left shoulder.  Left arm placed in a sling.  Patient rolled supine,  extubated and taken to recovery room in stable condition.                                                Vania Rea. Supple, M.D.    KMS/MEDQ  D:  03/29/2002  T:  03/29/2002  Job:  [id number]

## 2010-09-06 NOTE — Op Note (Signed)
[name], Madison Gill                  ACCOUNT NO.:  [id number]   MEDICAL RECORD NO.:  [id number]          PATIENT TYPE:  OUT   LOCATION:  DAY                          FACILITY:  [facility]   PHYSICIAN:  Lindaann Slough, M.D.  DATE OF BIRTH:  12-Jun-1955   DATE OF PROCEDURE:  04/21/2005  DATE OF DISCHARGE:                                 OPERATIVE REPORT   PREOPERATIVE DIAGNOSIS:  Left ureteropelvic junction stone with  hydronephrosis and nonfunctioning right kidney.   POSTOPERATIVE DIAGNOSIS:  Left ureteropelvic junction stone with  hydronephrosis and nonfunctioning right kidney.   PROCEDURE:  Cystoscopy with left retrograde pyelogram, ureteroscopy, Holmium  laser left ureteropelvic junction stone and insertion of double-J catheter.   SURGEON:  Danae Chen, M.D.   ANESTHESIA:  General.   DATE OF PROCEDURE:  04/21/2005   INDICATIONS:  The patient is a 55 year old female who was seen in  consultation last night with a 2-3 day history of severe left flank pain  associated with nausea, vomiting and diarrhea.  Madison Gill has a past history of  kidney stones; however, Madison Gill has never had any procedure for the stones and  has never passed them. Madison Gill is also known to have a nonfunctioning right  kidney. CT scan of the abdomen and pelvis showed a 4-5 mm stone at the left  UPJ with hydronephrosis and several stones in the kidney, the largest one  measures 8.2 mm and is in the lower pole of the kidney. Her creatinine went  up to 4.2 on 04/20/05 from 2.3 on 04/19/05. Madison Gill also had been passing only a  small amount of urine for the past two to three days. Madison Gill is scheduled for  cystoscopy, stone manipulation and insertion of double-J catheter.   DESCRIPTION OF PROCEDURE:  Under general anesthesia, the patient was prepped  and draped and placed in the dorsolithotomy position. A #22 Wappler  cystoscope was inserted in the bladder. The bladder mucosa is normal. There  is no stone or tumor in the bladder. The  ureteral orifices are in normal  position and shape.   Retrograde pyelogram.   A cone-tip catheter was passed through the cystoscope and into the left  ureteral orifice. Contrast was then injected through the cone-tip catheter.  The ureter appears normal. There is a filling defect at the UPJ, and the  renal pelvis and calyces are moderately dilated. The cone-tip catheter was  then removed.   A Sensor guidewire was then passed through the cystoscope into the left  ureter all the way up into the renal pelvis. The cystoscope was then  removed. The 6.5 French rigid ureteroscope was then passed in the bladder  and into the distal ureter, but the scope could not be advanced beyond the  intramural ureter.  The ureteroscope was then removed. The inner sheath of  the ureteroscope access sheath was then passed over the guidewire and the  intramural ureter was dilated.  The ureteroscope access sheath was then  removed. The ureteroscope was then passed in the bladder and into the ureter  and even after dilation, it was  difficult to pass the ureteroscope up the  ureter.  The ureter has a small diameter.  The ureteroscope was then  removed.  The ureteroscope access sheath was then passed over the guidewire  and then the ureter was dilated up to the upper ureter. The ureteroscope  access sheath was then removed and the ureteroscope was then reinserted in  the ureter and advanced all the way up into the upper ureter where a stone  is visualized at the UPJ. Then with the 200 Holmium laser micro fiber, the  stone was fragmented in multiple small fragments. Those fragments then  migrated back into the  kidney. At this point, the UPJ was wide open. The  rigid ureteroscope was then removed. Several attempts were then made to pass  a flexible ureteroscope all the way up into the ureter but were unsuccessful  because of the size of the ureter. Because those fragments are fairly small,  it is felt that Madison Gill  will be able to pass those fragments when we remove the  double-J catheter. The flexible ureteroscope was then removed. The guide  wire was then back loaded into the cystoscope and a #6-French 24 double-J  catheter was passed over the guidewire. The proximal curl of the double-J  catheter is in the renal pelvis and the distal curl is in the bladder. The  bladder was then emptied and the cystoscope and guidewire were removed.   The patient tolerated the procedure well and left the OR in satisfactory  condition to post anesthesia care unit.      Lindaann Slough, M.D.  Electronically Signed     MN/MEDQ  D:  04/21/2005  T:  04/21/2005  Job:  [id number]   cc:   Dr. Arta Silence

## 2010-09-06 NOTE — Cardiovascular Report (Signed)
NAME:  Madison Gill, Madison Gill                  ACCOUNT NO.:  [id number]   MEDICAL RECORD NO.:  [id number]          PATIENT TYPE:  OIB   LOCATION:  NA                           FACILITY:  [facility]   PHYSICIAN:  Colleen Can. Deborah Chalk, M.D.DATE OF BIRTH:  1955/11/27   DATE OF PROCEDURE:  02/04/2006  DATE OF DISCHARGE:                              CARDIAC CATHETERIZATION   HISTORY:  Ms. Sublette is referred for evaluation of questionable abnormal  reversible anterior defect noted on stress Cardiolite study.  She is  referred for catheterization.   PROCEDURE:  Left heart catheterization with selective coronary angiography,  left ventricular angiography.  __________ site of percutaneous right femoral  artery.   Catheters, 4-French 4curved Judkins right and left coronary catheters, 4-  French pigtail ventriculographic catheter.   CONTRAST MATERIAL:  Omnipaque.   MEDICATIONS:  Given prior to procedure Valium 10 mg p.o.   MEDICATIONS:  Given during the procedure Versed 3 mg IV.   COMMENTS:  The patient tolerated the procedure well.   HEMODYNAMIC DATA:  The aortic pressure 119/78, LV was 118/8-13.  There is no  aortic valve gradient noted on pullback.   ANGIOGRAPHIC DATA:  Left ventricular angiogram was performed in RAO  position.  Overall cardiac size and silhouette was normal.  The global  ejection fraction estimated to be 60%.  Regional wall motion was normal.  There was no mitral regurgitation noted.   Coronary arteries arise and distribute normally.  It is a well-balanced  circulation.  1. Left main coronary artery is normal.  2. Left anterior descending is normal.  3. Intermediate coronary artery is normal.  4. Left circumflex is normal.  5. Right coronary artery is a moderate size dominant vessel.  It is      normal.   OVERALL IMPRESSION:  1. Normal left ventricular function.  2. Normal coronary artery.      Colleen Can. Deborah Chalk, M.D.  Electronically Signed     SNT/MEDQ  D:  02/04/2006   T:  02/05/2006  Job:  [id number]   cc:   Dyke Maes, M.D.  Cassell Clement, M.D.

## 2010-09-06 NOTE — Discharge Summary (Signed)
Madison Gill, Madison Gill                  ACCOUNT NO.:  [id number]   MEDICAL RECORD NO.:  [id number]          PATIENT TYPE:  INP   LOCATION:  5709                         FACILITY:  [facility]   PHYSICIAN:  Maree Krabbe, M.D.DATE OF BIRTH:  Apr 06, 1956   DATE OF ADMISSION:  04/20/2005  DATE OF DISCHARGE:  04/22/2005                                 DISCHARGE SUMMARY   ADDENDUM TO DISCHARGE SUMMARY:   ADDITIONAL DISCHARGE DIAGNOSIS:  Ovarian cyst.   When the patient had a CT of the abdomen on admission, in addition to the  hydronephrosis and stones, she had a 4-cm left ovarian cyst.  The remainder  of the visualized uterus and adnexal regions were unremarkable.  Pelvic  ultrasound is pending at the time of this dictation.   Results will be followed up and GYN referral made if appropriate.     ______________________________  Duke Salvia. Eliott Nine, M.D.      Maree Krabbe, M.D.  Electronically Signed    CBD/MEDQ  D:  04/22/2005  T:  04/22/2005  Job:  [id number]

## 2010-09-06 NOTE — Consult Note (Signed)
Madison Gill, Madison Gill                  ACCOUNT NO.:  [id number]   MEDICAL RECORD NO.:  [id number]          PATIENT TYPE:  INP   LOCATION:  5709                         FACILITY:  [facility]   PHYSICIAN:  Lindaann Slough, M.D.  DATE OF BIRTH:  Sep 10, 1955   DATE OF CONSULTATION:  04/20/2005  DATE OF DISCHARGE:  04/21/2005                                   CONSULTATION   REQUESTING PHYSICIAN:  Maree Krabbe, M.D.   REASON FOR CONSULTATION:  Left flank pain.   HISTORY OF PRESENT ILLNESS:  The patient is a 55 year old female who has a 2-  3 day history of nausea, vomiting, diarrhea and hematuria.  Then, she  started having left flank pain two days ago and the pain became severe.  She  has been voiding only a small amount of urine for the past two days.  She  was seen in the emergency room yesterday and was given IV fluid and treated  with morphine.  She was sent home.  However, she returned today complaining  of severe left flank pain.  She is known to have a nonfunctioning right  kidney and is also known to have multiple small left renal calculi.  CT scan  of the abdomen and pelvis showed an atrophic right kidney with compensatory  hypertrophy of the left kidney.  There are several small stones in the left  kidney and a 4-5 millimeter stone at the left UPJ with hydronephrosis.   PAST MEDICAL HISTORY:  Hypertension.   MEDICATIONS:  1.  Coreg 25 mg twice a day.  2.  Diovan 320 mg twice a day.  3.  Hydrochlorothiazide 12.5 mg daily.   ALLERGIES:  No known drug allergies.  PAST SURGICAL HISTORY: cholecystectomy, left shoulder surgery, C-section and  surgery for ectopic pregnancy.   SOCIAL HISTORY:  She is married.  Has two children.  Does not smoke or  drink.   FAMILY HISTORY:  Her father and one brother have history of kidney stones;  mother is doing well.  She has two brothers.   REVIEW OF SYSTEMS:  She has been complaining of nausea, vomiting, abdominal  pain, been voiding a small  amount of urine at a time.  Everything else is  negative.   PHYSICAL EXAMINATION:  GENERAL:  She is a well-built 55 year old female in  no distress at this time.  VITAL SIGNS:  Blood pressure 122/77, pulse 89, respirations 20, temperature  97.4.  SKIN:  Warm and dry.  HEAD:  Normal.  EENT:  She has pink conjunctivae.  NECK:  Supple.  She has no cervical adenopathy, no thyromegaly.  LUNGS:  Clear to percussion and auscultation.  HEART:  Regular rhythm.  ABDOMEN:  Soft, tender in the left flank and she has left severe tenderness.  She has no hepatomegaly, no splenomegaly.  Kidneys not palpable  The abdomen  is not distended.  She has no umbilical or inguinal hernia.  Bowel sounds  are normal.  Her meatus is normal.  She has no cystocele, no caruncle.  PELVIC:  Cervix is firm, in the midline, nontender.  She has no adnexal  mass.  RECTAL:  Sphincter tone is normal.  Rectal mucosa is smooth.  There is no  rectal mass.   LABORATORY DATA:  Her creatinine went up to 4.2 today from 2.3 yesterday.   IMPRESSION:  1.  Left obstructing UPJ calculus.  2.  Left renal calculi.  3.  Atrophic right kidney.  4.  Hypertension.   PLAN:  The patient needs stone manipulation and insertion of double J  catheter to relieve the obstruction.  My plan was to do the procedure  tonight.  However, she ate dinner right before the consultation request and,  because of anesthesia risks, we will do her procedure first thing in the  morning.  The procedure risks and benefits have been discussed in detail  with the patient and her mother.  They understand and are agreeable.      Lindaann Slough, M.D.  Electronically Signed     MN/MEDQ  D:  04/20/2005  T:  04/21/2005  Job:  [id number]

## 2010-09-10 ENCOUNTER — Ambulatory Visit: Payer: 59 | Admitting: Family Medicine

## 2011-01-31 ENCOUNTER — Other Ambulatory Visit: Payer: Self-pay | Admitting: Obstetrics and Gynecology

## 2011-01-31 DIAGNOSIS — Z1231 Encounter for screening mammogram for malignant neoplasm of breast: Secondary | ICD-10-CM

## 2011-02-03 LAB — CBC
HCT: 40.8
Hemoglobin: 13.9
MCHC: 34.1
Platelets: 305
RDW: 13.6

## 2011-02-19 ENCOUNTER — Other Ambulatory Visit: Payer: Self-pay | Admitting: Obstetrics and Gynecology

## 2011-03-04 ENCOUNTER — Ambulatory Visit
Admission: RE | Admit: 2011-03-04 | Discharge: 2011-03-04 | Disposition: A | Discharge status: Home / Self Care | Payer: 59 | Source: Ambulatory Visit | Attending: Obstetrics and Gynecology | Admitting: Obstetrics and Gynecology

## 2011-03-04 DIAGNOSIS — Z1231 Encounter for screening mammogram for malignant neoplasm of breast: Secondary | ICD-10-CM

## 2011-07-03 ENCOUNTER — Encounter: Payer: Self-pay | Admitting: *Deleted

## 2011-07-03 DIAGNOSIS — F32A Depression, unspecified: Secondary | ICD-10-CM | POA: Insufficient documentation

## 2011-07-03 DIAGNOSIS — Z87898 Personal history of other specified conditions: Secondary | ICD-10-CM | POA: Insufficient documentation

## 2011-07-03 DIAGNOSIS — M791 Myalgia, unspecified site: Secondary | ICD-10-CM | POA: Insufficient documentation

## 2011-07-03 DIAGNOSIS — Z87442 Personal history of urinary calculi: Secondary | ICD-10-CM | POA: Insufficient documentation

## 2011-07-03 DIAGNOSIS — F329 Major depressive disorder, single episode, unspecified: Secondary | ICD-10-CM | POA: Insufficient documentation

## 2011-07-03 DIAGNOSIS — N189 Chronic kidney disease, unspecified: Secondary | ICD-10-CM | POA: Insufficient documentation

## 2011-07-03 DIAGNOSIS — N289 Disorder of kidney and ureter, unspecified: Secondary | ICD-10-CM | POA: Insufficient documentation

## 2011-07-03 DIAGNOSIS — R519 Headache, unspecified: Secondary | ICD-10-CM | POA: Insufficient documentation

## 2012-01-23 ENCOUNTER — Other Ambulatory Visit: Payer: Self-pay | Admitting: Obstetrics and Gynecology

## 2012-01-23 DIAGNOSIS — Z1231 Encounter for screening mammogram for malignant neoplasm of breast: Secondary | ICD-10-CM

## 2012-03-04 ENCOUNTER — Ambulatory Visit
Admission: RE | Admit: 2012-03-04 | Discharge: 2012-03-04 | Disposition: A | Discharge status: Home / Self Care | Payer: 59 | Source: Ambulatory Visit | Attending: Obstetrics and Gynecology | Admitting: Obstetrics and Gynecology

## 2012-03-04 DIAGNOSIS — Z1231 Encounter for screening mammogram for malignant neoplasm of breast: Secondary | ICD-10-CM

## 2012-05-31 ENCOUNTER — Encounter (HOSPITAL_COMMUNITY): Payer: Self-pay | Admitting: *Deleted

## 2012-05-31 ENCOUNTER — Emergency Department (HOSPITAL_COMMUNITY)
Admission: EM | Admit: 2012-05-31 | Discharge: 2012-05-31 | Disposition: A | Discharge status: Home Health | Payer: 59 | Attending: Emergency Medicine | Admitting: Emergency Medicine

## 2012-05-31 ENCOUNTER — Emergency Department (HOSPITAL_COMMUNITY): Payer: 59

## 2012-05-31 DIAGNOSIS — G8929 Other chronic pain: Secondary | ICD-10-CM | POA: Insufficient documentation

## 2012-05-31 DIAGNOSIS — E860 Dehydration: Secondary | ICD-10-CM | POA: Insufficient documentation

## 2012-05-31 DIAGNOSIS — R110 Nausea: Secondary | ICD-10-CM | POA: Insufficient documentation

## 2012-05-31 DIAGNOSIS — Z87440 Personal history of urinary (tract) infections: Secondary | ICD-10-CM | POA: Insufficient documentation

## 2012-05-31 DIAGNOSIS — N189 Chronic kidney disease, unspecified: Secondary | ICD-10-CM | POA: Insufficient documentation

## 2012-05-31 DIAGNOSIS — M549 Dorsalgia, unspecified: Secondary | ICD-10-CM | POA: Insufficient documentation

## 2012-05-31 DIAGNOSIS — Z79899 Other long term (current) drug therapy: Secondary | ICD-10-CM | POA: Insufficient documentation

## 2012-05-31 DIAGNOSIS — R0602 Shortness of breath: Secondary | ICD-10-CM | POA: Insufficient documentation

## 2012-05-31 DIAGNOSIS — R300 Dysuria: Secondary | ICD-10-CM | POA: Insufficient documentation

## 2012-05-31 DIAGNOSIS — I10 Essential (primary) hypertension: Secondary | ICD-10-CM | POA: Insufficient documentation

## 2012-05-31 DIAGNOSIS — R0789 Other chest pain: Secondary | ICD-10-CM | POA: Insufficient documentation

## 2012-05-31 DIAGNOSIS — I129 Hypertensive chronic kidney disease with stage 1 through stage 4 chronic kidney disease, or unspecified chronic kidney disease: Secondary | ICD-10-CM | POA: Insufficient documentation

## 2012-05-31 DIAGNOSIS — E119 Type 2 diabetes mellitus without complications: Secondary | ICD-10-CM | POA: Insufficient documentation

## 2012-05-31 DIAGNOSIS — Z87442 Personal history of urinary calculi: Secondary | ICD-10-CM | POA: Insufficient documentation

## 2012-05-31 DIAGNOSIS — R002 Palpitations: Secondary | ICD-10-CM | POA: Insufficient documentation

## 2012-05-31 DIAGNOSIS — R509 Fever, unspecified: Secondary | ICD-10-CM | POA: Insufficient documentation

## 2012-05-31 LAB — URINE MICROSCOPIC-ADD ON

## 2012-05-31 LAB — BASIC METABOLIC PANEL
BUN: 34 mg/dL — ABNORMAL HIGH (ref 6–23)
CO2: 26 mEq/L (ref 19–32)
Calcium: 10.1 mg/dL (ref 8.4–10.5)
Creatinine, Ser: 0.94 mg/dL (ref 0.50–1.10)
GFR calc non Af Amer: 67 mL/min — ABNORMAL LOW (ref >90)
Glucose, Bld: 231 mg/dL — ABNORMAL HIGH (ref 70–99)

## 2012-05-31 LAB — URINALYSIS, ROUTINE W REFLEX MICROSCOPIC
Bilirubin Urine: NEGATIVE
Glucose, UA: >1000 mg/dL — AB
Hgb urine dipstick: NEGATIVE
Ketones, ur: NEGATIVE mg/dL
pH: 5 (ref 5.0–8.0)

## 2012-05-31 LAB — CBC
Hemoglobin: 15.7 g/dL — ABNORMAL HIGH (ref 12.0–15.0)
MCH: 31.1 pg (ref 26.0–34.0)
MCHC: 35 g/dL (ref 30.0–36.0)
MCV: 88.9 fL (ref 78.0–100.0)
RBC: 5.05 MIL/uL (ref 3.87–5.11)

## 2012-05-31 LAB — POCT I-STAT TROPONIN I: Troponin i, poc: 0 ng/mL (ref 0.00–0.08)

## 2012-05-31 MED ORDER — DEXTROSE 5 % IV SOLN
1.0000 g | Freq: Once | INTRAVENOUS | Status: AC
  Administered 2012-05-31: 1 g via INTRAVENOUS
  Filled 2012-05-31: qty 10

## 2012-05-31 MED ORDER — ONDANSETRON HCL 4 MG/2ML IJ SOLN
4.0000 mg | Freq: Once | INTRAMUSCULAR | Status: AC
  Administered 2012-05-31: 4 mg via INTRAVENOUS
  Filled 2012-05-31: qty 2

## 2012-05-31 MED ORDER — SODIUM CHLORIDE 0.9 % IV BOLUS (SEPSIS)
1000.0000 mL | Freq: Once | INTRAVENOUS | Status: AC
  Administered 2012-05-31: 1000 mL via INTRAVENOUS

## 2012-05-31 NOTE — ED Notes (Addendum)
Pt states c/o substernal chest tightness that radiates to the left arm that started today. Pain relived by ibuprofen, pain exacerbated by exertion.  Pt symptoms include sweating, sob, and nausea. Denies vomiting

## 2012-05-31 NOTE — ED Provider Notes (Signed)
History     CSN: [id number]  Arrival date & time 05/31/12  0054   First MD Initiated Contact with Patient 05/31/12 0122      Chief Complaint  Patient presents with  . Chest Pain    (Consider location/radiation/quality/duration/timing/severity/associated sxs/prior treatment) HPI Hx per PT> taking Abx for UTI x 4 days with persistent symptoms despite keflex. Has felt febrile and achy - no recorded fever. Feeling worse today and now developing low left sided back pain and fast heart rate with SOB and chest tightness. nausea no vomiting. Only has one kidney which is on the left side - followed by Urology for h/o kidney stones. Symptoms mod to severe, progressive and worsening.  Past Medical History  Diagnosis Date  . Myalgia   . Hypertension   . Chronic renal insufficiency     in the setting of a solitary functioning left kidney with polycystic kidney disease  and hypertension  . History of kidney stones   . Anxiety and depression   . H/O chest pain   . Diabetes mellitus   . HA (headache)     hx of    Past Surgical History  Procedure Laterality Date  . Cholecystectomy  2002    Dr. Ovidio Kin  . Cardiac catheterization  02/04/2006    normal  . Other surgical history      childbirth x 2  . Shoulder surgery      Family History  Problem Relation Age of Onset  . Diabetes Mother   . Hypertension Mother   . Diabetes Father   . Hypertension Father     History  Substance Use Topics  . Smoking status: Never Smoker   . Smokeless tobacco: Not on file  . Alcohol Use:     OB History   Grav Para Term Preterm Abortions TAB SAB Ect Mult Living                  Review of Systems  Constitutional: Positive for fever. Negative for chills.  HENT: Negative for neck pain and neck stiffness.   Eyes: Negative for visual disturbance.  Respiratory: Positive for chest tightness and shortness of breath.   Cardiovascular: Positive for palpitations. Negative for leg swelling.   Gastrointestinal: Positive for nausea. Negative for vomiting and abdominal pain.  Genitourinary: Positive for dysuria.  Musculoskeletal: Positive for back pain.  Skin: Negative for rash.  Neurological: Negative for headaches.  All other systems reviewed and are negative.    Allergies  Crestor; Lipitor; Ramipril; and Toprol xl  Home Medications   Current Outpatient Rx  Name  Route  Sig  Dispense  Refill  . carvedilol (COREG) 12.5 MG tablet   Oral   Take 25 mg by mouth 2 (two) times daily with a meal.          . FENOFIBRATE PO   Oral   Take by mouth 1 dose over 46 hours.           Marland Kitchen glimepiride (AMARYL) 2 MG tablet   Oral   Take 1 mg by mouth every morning.          . Losartan Potassium (COZAAR PO)   Oral   Take 100 mg by mouth 1 dose over 46 hours.          . metFORMIN (GLUMETZA) 500 MG (MOD) 24 hr tablet   Oral   Take 500 mg by mouth 2 (two) times daily with a meal.  BP 178/99  Temp(Src) 98.3 F (36.8 C) (Oral)  Resp 24  SpO2 98%  Physical Exam  Constitutional: She is oriented to person, place, and time. She appears well-developed and well-nourished.  HENT:  Head: Normocephalic and atraumatic.  Eyes: Conjunctivae and EOM are normal. Pupils are equal, round, and reactive to light.  Neck: Trachea normal. Neck supple. No thyromegaly present.  Cardiovascular: Normal rate, regular rhythm, S1 normal, S2 normal and normal pulses.     No systolic murmur is present   No diastolic murmur is present  Pulses:      Radial pulses are 2+ on the right side, and 2+ on the left side.  Pulmonary/Chest: Effort normal and breath sounds normal. No respiratory distress. She has no wheezes. She has no rhonchi. She has no rales. She exhibits no tenderness.  Abdominal: Soft. Normal appearance and bowel sounds are normal. There is no tenderness. There is negative Murphy's sign.  Mild L CVAT  Musculoskeletal:  BLE:s Calves nontender, no cords or erythema,  negative Homans sign  Neurological: She is alert and oriented to person, place, and time. She has normal strength. No cranial nerve deficit or sensory deficit. GCS eye subscore is 4. GCS verbal subscore is 5. GCS motor subscore is 6.  Skin: Skin is warm and dry. No rash noted. She is not diaphoretic.  Psychiatric: Her speech is normal.  Anxious, otherwise cooperative and appropriate    ED Course  Procedures (including critical care time)  Results for orders placed during the hospital encounter of 05/31/12  CBC      Result Value Range   WBC 8.2  4.0 - 10.5 K/uL   RBC 5.05  3.87 - 5.11 MIL/uL   Hemoglobin 15.7 (*) 12.0 - 15.0 g/dL   HCT 62.1  30.8 - 65.7 %   MCV 88.9  78.0 - 100.0 fL   MCH 31.1  26.0 - 34.0 pg   MCHC 35.0  30.0 - 36.0 g/dL   RDW 84.6  96.2 - 95.2 %   Platelets 342  150 - 400 K/uL  BASIC METABOLIC PANEL      Result Value Range   Sodium 133 (*) 135 - 145 mEq/L   Potassium 4.4  3.5 - 5.1 mEq/L   Chloride 94 (*) 96 - 112 mEq/L   CO2 26  19 - 32 mEq/L   Glucose, Bld 231 (*) 70 - 99 mg/dL   BUN 34 (*) 6 - 23 mg/dL   Creatinine, Ser 8.41  0.50 - 1.10 mg/dL   Calcium 32.4  8.4 - 40.1 mg/dL   GFR calc non Af Amer 67 (*) >90 mL/min   GFR calc Af Amer 77 (*) >90 mL/min  PRO B NATRIURETIC PEPTIDE      Result Value Range   Pro B Natriuretic peptide (BNP) 17.8  0 - 125 pg/mL  URINALYSIS, ROUTINE W REFLEX MICROSCOPIC      Result Value Range   Color, Urine YELLOW  YELLOW   APPearance CLEAR  CLEAR   Specific Gravity, Urine 1.020  1.005 - 1.030   pH 5.0  5.0 - 8.0   Glucose, UA >1000 (*) NEGATIVE mg/dL   Hgb urine dipstick NEGATIVE  NEGATIVE   Bilirubin Urine NEGATIVE  NEGATIVE   Ketones, ur NEGATIVE  NEGATIVE mg/dL   Protein, ur NEGATIVE  NEGATIVE mg/dL   Urobilinogen, UA 0.2  0.0 - 1.0 mg/dL   Nitrite NEGATIVE  NEGATIVE   Leukocytes, UA SMALL (*) NEGATIVE  URINE MICROSCOPIC-ADD ON  Result Value Range   Squamous Epithelial / LPF RARE  RARE   WBC, UA 3-6  <3  WBC/hpf   RBC / HPF 0-2  <3 RBC/hpf   Bacteria, UA RARE  RARE  D-DIMER, QUANTITATIVE      Result Value Range   D-Dimer, Quant 0.38  0.00 - 0.48 ug/mL-FEU  POCT PREGNANCY, URINE      Result Value Range   Preg Test, Ur NEGATIVE  NEGATIVE  POCT I-STAT TROPONIN I      Result Value Range   Troponin i, poc 0.00  0.00 - 0.08 ng/mL   Comment 3           CG4 I-STAT (LACTIC ACID)      Result Value Range   Lactic Acid, Venous 2.07  0.5 - 2.2 mmol/L  POCT I-STAT TROPONIN I      Result Value Range   Troponin i, poc 0.00  0.00 - 0.08 ng/mL   Comment 3            Dg Chest Portable 1 View  05/31/2012  *RADIOLOGY REPORT*  Clinical Data: Chest pain and shortness of breath; nausea.  History of diabetes.  PORTABLE CHEST - 1 VIEW  Comparison: None.  Findings: The lungs are well-aerated and clear.  There is no evidence of focal opacification, pleural effusion or pneumothorax.  The cardiomediastinal silhouette is mildly enlarged.  No acute osseous abnormalities are seen.  IMPRESSION: No acute cardiopulmonary process seen; mild cardiomegaly noted.   Original Report Authenticated By: Tonia Ghent, M.D.     Date: 05/31/2012  Rate: 116  Rhythm: sinus tachycardia  QRS Axis: normal  Intervals: normal  ST/T Wave abnormalities: nonspecific ST changes  Conduction Disutrbances:none  Narrative Interpretation:   Old EKG Reviewed: none available  IVFs, IV rocephin. UA, labs, ECG  Recheck - feeling much better and comfortable to go home, results shared and plan close PCP follow up. Serial troponins and d-dimer negative.  MDM  Diabetic female with viral symptoms and h/o anxiety, persistent dysuria on 4 days of ABx. Evaluated with ECG, CXR, labs and UA all reviewed. No UTI, no hematuria Normal pulm exam, CXR and no hypoxia Labs show no leukocytosis or renal insufficiency mild tachycardia in triage resolved and symptoms improved.  Old records reviewed - cardiac cath 2007 no CAD      Sunnie Nielsen,  MD 05/31/12 [phone]

## 2012-06-05 ENCOUNTER — Other Ambulatory Visit: Payer: Self-pay

## 2012-06-25 ENCOUNTER — Ambulatory Visit: Payer: 59 | Admitting: Cardiology

## 2012-08-03 ENCOUNTER — Ambulatory Visit (INDEPENDENT_AMBULATORY_CARE_PROVIDER_SITE_OTHER): Payer: Self-pay | Admitting: Family Medicine

## 2012-08-03 VITALS — BP 142/88 | Wt 172.0 lb

## 2012-08-03 DIAGNOSIS — E119 Type 2 diabetes mellitus without complications: Secondary | ICD-10-CM

## 2012-08-03 NOTE — Progress Notes (Signed)
Patient presents today for yearly pharmacy consult as part of the employer-sponsored [employer] program. Patient is currently being followed by Cranford Mon, RN, but comes to me for an annual pharamcy visit. Current diabetes regimen includes Invokana, Glimepiride, and Metformin. Patient also continues on daily ASA, and ARB. Patient is followed by Dr. Jonita Albee, PCP with last follow-up in January of this year. Patient has a pending appt for late April (3 month follow-up), but generally returns every 6 months for follow-up. No med changes or major health changes at this time.  Diabetes Assessment: True Result Meter; Type of Diabetes: Type 2; MD managing Diabetes [place]; checks blood glucose 2-3 times a day; checks feet daily; takes medications as prescribed; takes an aspirin a day; hypoglycemia frequency none; Sees Diabetes provider 2 times per year; 7 day CBG average 126; 14 day CBG average 126; 30 day CBG average 136; A1c 6.4 Other Diabetes History: Current med regimen includes Invokana 100 mg daily, Metformin 500 mg twice daily, and glimepiride 1 mg twice daily. Patient does maintain good medication compliance and is having no adverse events at this time. Despite an initial UTI after starting on Invokana, patient has tolerated this regimen well. Patient is hopeful she will be able to reduce number of medication soon if A1c remains under good control. Patient did not bring meter today but is currently testing 3-4 times daily in order to determine glucose trends. Glucose monitoring occurs fasting, when symptomatic, and sometimes 2 hours post-prandially in order to evaluate effect of different foods on glucose. Hypoglycemia frequency is rare. Patient does demonstrate appropriate correction of hypoglycemia. Patient denies signs and symptoms of neuropathy including numbness, tingling, burning and symptoms of foot infection. Patient is not due for yearly eye exam.  Lifestyle Factors: Exercise - Patient  maintains a consistent exercise regimen and walks in her neighborhood 3-4 times per week for 30 minutes or more.   Diet - Patient feels she has good control of diet and continues making this a focus of hers. In the past patient has made significant dietary improvements including stopping drinking [address] and increasing vegetable intake. Patient works night shift and sometimes needs a caffeinated drink, so she has been using caffeinated Crystal [employer] packets in water. Patient does report snacking on nuts often but does not eat sweets.  Assessment: Patient presents today for annual pharmacy consult as part of [facility]. She will return to the care of Cranford Mon, RN after this visit. Overall the patient has a good understading of medications and their uses and is not having any adverse events at this time. A1c has improved over the past 2-3 months and is now at goal with value of 6.4 (previously 7.3). Patient exercises regularly and attempts to maintain a healthy diet. She will follow-up with Marylu Lund in May.  Plan: 1) Continue exercising regularly - great job with this! 2) Continue to maintain a healthy diet, eating plenty of vegetables. And attempt to select low/no sodium nuts as a snack 3) Continue testing regularly 4) Remember to call Marylu Lund to schedule an appt.

## 2013-02-02 ENCOUNTER — Other Ambulatory Visit: Payer: Self-pay

## 2013-02-02 DIAGNOSIS — Z1231 Encounter for screening mammogram for malignant neoplasm of breast: Secondary | ICD-10-CM

## 2013-02-24 ENCOUNTER — Other Ambulatory Visit: Payer: Self-pay

## 2013-03-08 ENCOUNTER — Ambulatory Visit
Admission: RE | Admit: 2013-03-08 | Discharge: 2013-03-08 | Disposition: A | Discharge status: Home / Self Care | Payer: 59 | Source: Ambulatory Visit

## 2013-03-08 DIAGNOSIS — Z1231 Encounter for screening mammogram for malignant neoplasm of breast: Secondary | ICD-10-CM

## 2013-05-02 NOTE — Progress Notes (Signed)
Patient ID: Madison Gill, female   DOB: 1956/03/05, 58 y.o.   MRN: [id number] ATTENDING PHYSICIAN NOTE: I have reviewed the chart and agree with the plan as detailed above. Dorcas Mcmurray MD Pager [phone]

## 2013-06-28 ENCOUNTER — Other Ambulatory Visit: Payer: Self-pay | Admitting: Obstetrics and Gynecology

## 2013-07-22 ENCOUNTER — Emergency Department (INDEPENDENT_AMBULATORY_CARE_PROVIDER_SITE_OTHER): Payer: 59

## 2013-07-22 ENCOUNTER — Emergency Department (HOSPITAL_COMMUNITY)
Admission: EM | Admit: 2013-07-22 | Discharge: 2013-07-23 | Disposition: A | Discharge status: Home / Self Care | Payer: 59 | Attending: Emergency Medicine | Admitting: Emergency Medicine

## 2013-07-22 ENCOUNTER — Encounter (HOSPITAL_COMMUNITY): Payer: Self-pay | Admitting: Emergency Medicine

## 2013-07-22 ENCOUNTER — Emergency Department (HOSPITAL_COMMUNITY)
Admission: EM | Admit: 2013-07-22 | Discharge: 2013-07-22 | Disposition: A | Discharge status: Home / Self Care | Payer: 59 | Source: Home / Self Care | Attending: Family Medicine | Admitting: Family Medicine

## 2013-07-22 ENCOUNTER — Emergency Department (HOSPITAL_COMMUNITY): Payer: 59

## 2013-07-22 DIAGNOSIS — J069 Acute upper respiratory infection, unspecified: Secondary | ICD-10-CM | POA: Insufficient documentation

## 2013-07-22 DIAGNOSIS — R509 Fever, unspecified: Secondary | ICD-10-CM

## 2013-07-22 DIAGNOSIS — E119 Type 2 diabetes mellitus without complications: Secondary | ICD-10-CM | POA: Insufficient documentation

## 2013-07-22 DIAGNOSIS — R51 Headache: Secondary | ICD-10-CM

## 2013-07-22 DIAGNOSIS — Z87442 Personal history of urinary calculi: Secondary | ICD-10-CM | POA: Insufficient documentation

## 2013-07-22 DIAGNOSIS — Z8659 Personal history of other mental and behavioral disorders: Secondary | ICD-10-CM | POA: Insufficient documentation

## 2013-07-22 DIAGNOSIS — R059 Cough, unspecified: Secondary | ICD-10-CM

## 2013-07-22 DIAGNOSIS — Z792 Long term (current) use of antibiotics: Secondary | ICD-10-CM | POA: Insufficient documentation

## 2013-07-22 DIAGNOSIS — Z79899 Other long term (current) drug therapy: Secondary | ICD-10-CM | POA: Insufficient documentation

## 2013-07-22 DIAGNOSIS — D72829 Elevated white blood cell count, unspecified: Secondary | ICD-10-CM

## 2013-07-22 DIAGNOSIS — R519 Headache, unspecified: Secondary | ICD-10-CM

## 2013-07-22 DIAGNOSIS — N189 Chronic kidney disease, unspecified: Secondary | ICD-10-CM | POA: Insufficient documentation

## 2013-07-22 DIAGNOSIS — I129 Hypertensive chronic kidney disease with stage 1 through stage 4 chronic kidney disease, or unspecified chronic kidney disease: Secondary | ICD-10-CM | POA: Insufficient documentation

## 2013-07-22 DIAGNOSIS — R110 Nausea: Secondary | ICD-10-CM | POA: Insufficient documentation

## 2013-07-22 DIAGNOSIS — Z7982 Long term (current) use of aspirin: Secondary | ICD-10-CM | POA: Insufficient documentation

## 2013-07-22 DIAGNOSIS — R05 Cough: Secondary | ICD-10-CM

## 2013-07-22 DIAGNOSIS — R000 Tachycardia, unspecified: Secondary | ICD-10-CM | POA: Insufficient documentation

## 2013-07-22 LAB — URINALYSIS, ROUTINE W REFLEX MICROSCOPIC
Bilirubin Urine: NEGATIVE
Ketones, ur: 15 mg/dL — AB
NITRITE: NEGATIVE
PH: 5 (ref 5.0–8.0)
Protein, ur: NEGATIVE mg/dL
SPECIFIC GRAVITY, URINE: 1.037 — AB (ref 1.005–1.030)
Urobilinogen, UA: 0.2 mg/dL (ref 0.0–1.0)

## 2013-07-22 LAB — COMPREHENSIVE METABOLIC PANEL
ALT: 10 U/L (ref 0–35)
AST: 12 U/L (ref 0–37)
Albumin: 3.4 g/dL — ABNORMAL LOW (ref 3.5–5.2)
Alkaline Phosphatase: 71 U/L (ref 39–117)
BILIRUBIN TOTAL: 0.6 mg/dL (ref 0.3–1.2)
BUN: 17 mg/dL (ref 6–23)
CHLORIDE: 98 meq/L (ref 96–112)
CO2: 23 meq/L (ref 19–32)
CREATININE: 0.73 mg/dL (ref 0.50–1.10)
Calcium: 9.5 mg/dL (ref 8.4–10.5)
GFR calc Af Amer: >90 mL/min (ref >90)
Glucose, Bld: 209 mg/dL — ABNORMAL HIGH (ref 70–99)
Potassium: 3.9 mEq/L (ref 3.7–5.3)
Sodium: 138 mEq/L (ref 137–147)
Total Protein: 7.6 g/dL (ref 6.0–8.3)

## 2013-07-22 LAB — POCT URINALYSIS DIP (DEVICE)
BILIRUBIN URINE: NEGATIVE
Glucose, UA: 500 mg/dL — AB
Ketones, ur: NEGATIVE mg/dL
LEUKOCYTES UA: NEGATIVE
NITRITE: NEGATIVE
PH: 5.5 (ref 5.0–8.0)
PROTEIN: NEGATIVE mg/dL
Specific Gravity, Urine: 1.01 (ref 1.005–1.030)
Urobilinogen, UA: 0.2 mg/dL (ref 0.0–1.0)

## 2013-07-22 LAB — RAPID STREP SCREEN (MED CTR MEBANE ONLY): STREPTOCOCCUS, GROUP A SCREEN (DIRECT): NEGATIVE

## 2013-07-22 LAB — CSF CELL COUNT WITH DIFFERENTIAL
RBC Count, CSF: 0 /mm3
RBC Count, CSF: 2 /mm3 — ABNORMAL HIGH
TUBE #: 4
Tube #: 1
WBC CSF: 0 /mm3 (ref 0–5)
WBC, CSF: 2 /mm3 (ref 0–5)

## 2013-07-22 LAB — PROTEIN, CSF: Total  Protein, CSF: 18 mg/dL (ref 15–45)

## 2013-07-22 LAB — CBC WITH DIFFERENTIAL/PLATELET
Basophils Absolute: 0 10*3/uL (ref 0.0–0.1)
Basophils Relative: 0 % (ref 0–1)
EOS PCT: 0 % (ref 0–5)
Eosinophils Absolute: 0 10*3/uL (ref 0.0–0.7)
HCT: 43.9 % (ref 36.0–46.0)
HEMOGLOBIN: 15.1 g/dL — AB (ref 12.0–15.0)
LYMPHS ABS: 1 10*3/uL (ref 0.7–4.0)
LYMPHS PCT: 5 % — AB (ref 12–46)
MCH: 31.6 pg (ref 26.0–34.0)
MCHC: 34.4 g/dL (ref 30.0–36.0)
MCV: 91.8 fL (ref 78.0–100.0)
MONO ABS: 1.2 10*3/uL — AB (ref 0.1–1.0)
Monocytes Relative: 6 % (ref 3–12)
Neutro Abs: 16.4 10*3/uL — ABNORMAL HIGH (ref 1.7–7.7)
Neutrophils Relative %: 89 % — ABNORMAL HIGH (ref 43–77)
Platelets: 264 10*3/uL (ref 150–400)
RBC: 4.78 MIL/uL (ref 3.87–5.11)
RDW: 13.1 % (ref 11.5–15.5)
WBC: 18.6 10*3/uL — ABNORMAL HIGH (ref 4.0–10.5)

## 2013-07-22 LAB — URINE MICROSCOPIC-ADD ON

## 2013-07-22 LAB — GRAM STAIN

## 2013-07-22 LAB — GLUCOSE, CSF: Glucose, CSF: 116 mg/dL — ABNORMAL HIGH (ref 43–76)

## 2013-07-22 MED ORDER — ONDANSETRON 4 MG PO TBDP
8.0000 mg | ORAL_TABLET | Freq: Once | ORAL | Status: AC
  Administered 2013-07-22: 8 mg via ORAL

## 2013-07-22 MED ORDER — ONDANSETRON 4 MG PO TBDP
ORAL_TABLET | ORAL | Status: AC
  Filled 2013-07-22: qty 2

## 2013-07-22 MED ORDER — KETOROLAC TROMETHAMINE 60 MG/2ML IM SOLN
INTRAMUSCULAR | Status: AC
  Filled 2013-07-22: qty 2

## 2013-07-22 MED ORDER — SODIUM CHLORIDE 0.9 % IV BOLUS (SEPSIS)
1000.0000 mL | Freq: Once | INTRAVENOUS | Status: AC
Start: 2013-07-22 — End: 2013-07-22
  Administered 2013-07-22: 1000 mL via INTRAVENOUS

## 2013-07-22 MED ORDER — PROCHLORPERAZINE EDISYLATE 5 MG/ML IJ SOLN
10.0000 mg | Freq: Once | INTRAMUSCULAR | Status: AC
  Administered 2013-07-22: 10 mg via INTRAVENOUS
  Filled 2013-07-22: qty 2

## 2013-07-22 MED ORDER — KETOROLAC TROMETHAMINE 60 MG/2ML IM SOLN
60.0000 mg | Freq: Once | INTRAMUSCULAR | Status: AC
  Administered 2013-07-22: 60 mg via INTRAMUSCULAR

## 2013-07-22 MED ORDER — DIPHENHYDRAMINE HCL 50 MG/ML IJ SOLN
25.0000 mg | Freq: Once | INTRAMUSCULAR | Status: AC
  Administered 2013-07-22: 25 mg via INTRAVENOUS
  Filled 2013-07-22: qty 1

## 2013-07-22 MED ORDER — MAGNESIUM SULFATE 40 MG/ML IJ SOLN
2.0000 g | Freq: Once | INTRAMUSCULAR | Status: AC
  Administered 2013-07-22: 2 g via INTRAVENOUS
  Filled 2013-07-22: qty 50

## 2013-07-22 NOTE — ED Provider Notes (Signed)
CSN: [id number]     Arrival date & time 07/22/13  1536 History   None    Chief Complaint  Patient presents with  . multiple complaints     HPI  58 year old female with a history of diabetes presents complaining of headache and fever.  Onset of her illness was about a week ago. Her headache was gradual in onset about one week ago. It was initially mild to moderate in severity. He's gotten much worse for the past 2 days. It is severe. It is global in location. It is aggravated by nothing, relieved by Toradol that was given to her at urgent care today.  During the course of this illness she has felt like she had a fever most days this week. She had fever to 104 yesterday. She's had a moderate cough. Her cough is productive of clear sputum. She's had nasal congestion and clear rhinorrhea. She's had diffuse myalgias particularly in her back, arms, and legs. She's had some nausea. No vomiting. She was seen by her primary care physician a few days ago and diagnosed with a viral illness.  She presented to urgent care today complaining of worsening headache. There lab workup demonstrated a leukocytosis with a white count of 18. she was transferred here for further evaluation. Prior to transfer she was given a dose of Toradol with some improvement of her symptoms.  She denies any confusion or altered metal status, no neck pain or neck stiffness. No photophobia. No facial pain. She had a sore throat earlier in the week but this has resolved. No chest pain. No, pain. No diarrhea. No dysuria, urinary urgency, urinary frequency. No skin lesions or rash. No visual changes, weakness, numbness, paresthesias, difficulty with gait or coordination, difficulty with speech.  Past Medical History  Diagnosis Date  . Myalgia   . Hypertension   . Chronic renal insufficiency     in the setting of a solitary functioning left kidney with polycystic kidney disease  and hypertension  . History of kidney stones   . Anxiety  and depression   . H/O chest pain   . Diabetes mellitus   . HA (headache)     hx of   Past Surgical History  Procedure Laterality Date  . Cholecystectomy  2002    Dr. Alphonsa Overall  . Cardiac catheterization  02/04/2006    normal  . Other surgical history      childbirth x 2  . Shoulder surgery     Family History  Problem Relation Age of Onset  . Diabetes Mother   . Hypertension Mother   . Diabetes Father   . Hypertension Father    History  Substance Use Topics  . Smoking status: Never Smoker   . Smokeless tobacco: Not on file  . Alcohol Use:    OB History   Grav Para Term Preterm Abortions TAB SAB Ect Mult Living                 Review of Systems  Constitutional: Positive for fever, chills and fatigue. Negative for diaphoresis.  HENT: Positive for congestion, rhinorrhea and sore throat. Negative for trouble swallowing and voice change.   Eyes: Negative for photophobia.  Respiratory: Positive for cough. Negative for shortness of breath and wheezing.   Cardiovascular: Negative for chest pain and leg swelling.  Gastrointestinal: Positive for nausea. Negative for vomiting, abdominal pain and diarrhea.  Endocrine: Negative for polydipsia, polyphagia and polyuria.  Genitourinary: Negative for dysuria, urgency, frequency, flank pain,  vaginal bleeding, vaginal discharge and difficulty urinating.  Musculoskeletal: Positive for myalgias. Negative for neck pain and neck stiffness.  Skin: Negative for rash.  Neurological: Positive for headaches. Negative for weakness and numbness.  Psychiatric/Behavioral: Negative for confusion.  All other systems reviewed and are negative.      Allergies  Codeine; Crestor; Lipitor; Ramipril; and Toprol xl  Home Medications   Current Outpatient Rx  Name  Route  Sig  Dispense  Refill  . aspirin EC 81 MG tablet   Oral   Take 81 mg by mouth daily.         . Canagliflozin (INVOKANA) 100 MG TABS   Oral   Take 100 mg by mouth  daily.         . carvedilol (COREG) 25 MG tablet   Oral   Take 25 mg by mouth 2 (two) times daily with a meal.          . fenofibrate micronized (LOFIBRA) 200 MG capsule   Oral   Take 200 mg by mouth daily before breakfast.         . guaiFENesin (MUCINEX) 600 MG 12 hr tablet   Oral   Take 600 mg by mouth 2 (two) times daily as needed for cough or to loosen phlegm.         Marland Kitchen guaiFENesin-codeine (ROBITUSSIN AC) 100-10 MG/5ML syrup   Oral   Take 5 mLs by mouth 3 (three) times daily as needed for cough.         . losartan-hydrochlorothiazide (HYZAAR) 100-25 MG per tablet   Oral   Take 1 tablet by mouth daily.         . metFORMIN (GLUCOPHAGE) 500 MG tablet   Oral   Take 500 mg by mouth 2 (two) times daily with a meal.         . azithromycin (ZITHROMAX Z-PAK) 250 MG tablet   Oral   Take 1 tablet (250 mg total) by mouth daily.   4 tablet   0    BP 124/83  Pulse 99  Temp(Src) 98.2 F (36.8 C) (Oral)  Resp 20  Ht 5\' 4"  (1.626 m)  Wt 159 lb (72.122 kg)  BMI 27.28 kg/m2  SpO2 94% Physical Exam  Nursing note and vitals reviewed. Constitutional: She appears well-developed and well-nourished. No distress.  Ill appearing  HENT:  Head: Normocephalic and atraumatic.  Mouth/Throat: Oropharynx is clear and moist.  Clear rhinorrhea. No tenderness over frontal or maxillary sinuses.  Eyes: Conjunctivae and EOM are normal. Pupils are equal, round, and reactive to light. No scleral icterus.  Neck: Normal range of motion and phonation normal. Neck supple. No rigidity. No Brudzinski's sign and no Kernig's sign noted.  Cardiovascular: Regular rhythm, normal heart sounds and intact distal pulses.  Tachycardia present.  Exam reveals no gallop and no friction rub.   No murmur heard. Pulmonary/Chest: Effort normal and breath sounds normal. No stridor. No respiratory distress. She has no wheezes. She has no rales.  Abdominal: Soft. She exhibits no distension and no mass. There is  no tenderness. There is no rebound and no guarding.  Musculoskeletal: She exhibits no edema.  Neurological:  Alert, oriented X 4, GCS 15.  CN 2-12 intact.  5/5 strength in bilateral upper and lower extremities, all major muscle groups.  Normal sensation to light touch in bilateral upper and lower extremities.  Visual fields intact to confrontation.  Normal gait.  Negative Romberg.  Normal coordination.  Normal finger to nose  and heel to shin.  Skin: Skin is warm and dry. No ecchymosis, no petechiae and no rash noted.  Psychiatric: She has a normal mood and affect. Her behavior is normal. Judgment and thought content normal.    ED Course  LUMBAR PUNCTURE Performed by: Wendall Papa Authorized by: Wendall Papa Consent: Verbal consent obtained. written consent obtained. Risks and benefits: risks, benefits and alternatives were discussed Consent given by: patient Patient understanding: patient states understanding of the procedure being performed Required items: required blood products, implants, devices, and special equipment available Patient identity confirmed: verbally with patient, arm band and hospital-assigned identification number Time out: Immediately prior to procedure a "time out" was called to verify the correct patient, procedure, equipment, support staff and site/side marked as required. Indications: evaluation for infection Anesthesia: local infiltration Local anesthetic: lidocaine 1% without epinephrine Patient sedated: no Preparation: Patient was prepped and draped in the usual sterile fashion. Lumbar space: L4-L5 interspace Patient's position: right lateral decubitus Needle length: 3.5 in Number of attempts: 1 Fluid appearance: clear Tubes of fluid: 4 Post-procedure: site cleaned and adhesive bandage applied Patient tolerance: Patient tolerated the procedure well with no immediate complications.   (including critical care time) Labs Review Labs Reviewed   URINALYSIS, ROUTINE W REFLEX MICROSCOPIC - Abnormal; Notable for the following:    Specific Gravity, Urine 1.037 (*)    Glucose, UA >1000 (*)    Hgb urine dipstick TRACE (*)    Ketones, ur 15 (*)    Leukocytes, UA SMALL (*)    All other components within normal limits  URINE MICROSCOPIC-ADD ON - Abnormal; Notable for the following:    Squamous Epithelial / LPF FEW (*)    Bacteria, UA FEW (*)    All other components within normal limits  CSF CELL COUNT WITH DIFFERENTIAL - Abnormal; Notable for the following:    RBC Count, CSF 2 (*)    All other components within normal limits  GLUCOSE, CSF - Abnormal; Notable for the following:    Glucose, CSF 116 (*)    All other components within normal limits  RAPID STREP SCREEN  GRAM STAIN  CULTURE, BLOOD (ROUTINE X 2)  CULTURE, BLOOD (ROUTINE X 2)  CULTURE, GROUP A STREP  CSF CULTURE  CSF CELL COUNT WITH DIFFERENTIAL  PROTEIN, CSF  INFLUENZA PANEL BY PCR (TYPE A & B, H1N1)   Imaging Review Dg Chest 2 View  07/22/2013   CLINICAL DATA:  Body aches and fever.  EXAM: CHEST  2 VIEW  COMPARISON:  05/31/2012.  FINDINGS: The heart is enlarged but stable. There is mild tortuosity of the thoracic aorta. The lungs are clear of acute process. Minimal streaky basilar scarring changes or atelectasis. The bony thorax is intact.  IMPRESSION: No acute cardiopulmonary findings.   Electronically Signed   By: Kalman Jewels M.D.   On: 07/22/2013 14:07   Ct Head Wo Contrast  07/22/2013   CLINICAL DATA:  headache; headache, fever  EXAM: CT HEAD WITHOUT CONTRAST  CT MAXILLOFACIAL WITHOUT CONTRAST  TECHNIQUE: Multidetector CT imaging of the head and maxillofacial structures were performed using the standard protocol without intravenous contrast. Multiplanar CT image reconstructions of the maxillofacial structures were also generated.  COMPARISON:  None.  FINDINGS: CT HEAD FINDINGS  No acute intracranial abnormality. Specifically, no hemorrhage, hydrocephalus, mass  lesion, acute infarction, or significant intracranial injury. No acute calvarial abnormality.  CT MAXILLOFACIAL FINDINGS  There findings concerning for molar impaction in the base the right maxillary sinus. There is surrounding  mucosal thickening. The paranasal sinuses and mastoid air cells are otherwise patent. Concha bullosa appreciated within middle and superior turbinates. The ostia patent. There is no evidence of acute fracture nor dislocation.  The orbits, temporomandibular joints, and mandible are unremarkable.  Prominent lymph nodes are scattered throughout the neck within the anterior and posterior cervical chains. The largest is in the carotid space on the right measuring 1 cm.  IMPRESSION: 1.  No acute intracranial abnormality. 2. Findings concerning for molar impaction base of the right maxillary sinus with surrounding mucosal thickening. Clinically warranted further evaluation with oral surgery consultation recommended. 3. Prominent lymph nodes visualized in the anterior posterior cervical chains. Clinical correlation recommended.   Electronically Signed   By: Margaree Mackintosh M.D.   On: 07/22/2013 20:38   Ct Maxillofacial Wo Cm  07/22/2013   CLINICAL DATA:  headache; headache, fever  EXAM: CT HEAD WITHOUT CONTRAST  CT MAXILLOFACIAL WITHOUT CONTRAST  TECHNIQUE: Multidetector CT imaging of the head and maxillofacial structures were performed using the standard protocol without intravenous contrast. Multiplanar CT image reconstructions of the maxillofacial structures were also generated.  COMPARISON:  None.  FINDINGS: CT HEAD FINDINGS  No acute intracranial abnormality. Specifically, no hemorrhage, hydrocephalus, mass lesion, acute infarction, or significant intracranial injury. No acute calvarial abnormality.  CT MAXILLOFACIAL FINDINGS  There findings concerning for molar impaction in the base the right maxillary sinus. There is surrounding mucosal thickening. The paranasal sinuses and mastoid air cells  are otherwise patent. Concha bullosa appreciated within middle and superior turbinates. The ostia patent. There is no evidence of acute fracture nor dislocation.  The orbits, temporomandibular joints, and mandible are unremarkable.  Prominent lymph nodes are scattered throughout the neck within the anterior and posterior cervical chains. The largest is in the carotid space on the right measuring 1 cm.  IMPRESSION: 1.  No acute intracranial abnormality. 2. Findings concerning for molar impaction base of the right maxillary sinus with surrounding mucosal thickening. Clinically warranted further evaluation with oral surgery consultation recommended. 3. Prominent lymph nodes visualized in the anterior posterior cervical chains. Clinical correlation recommended.   Electronically Signed   By: Margaree Mackintosh M.D.   On: 07/22/2013 20:38     EKG Interpretation None      MDM   58 year old female presents complaining of fever, headache associated with URI symptoms including nasal congestion, rhinorrhea, cough. Has been ongoing for a week. Hea sinusesdache is worsening. She has not had any altered mental status. No neck pain or neck stiffness. She was seen in urgent care today. Found to have a leukocytosis to 18. Sent here for further evaluation.  She is febrile to 100.8, tachycardic to 122, normal blood pressure, normal respirate and oxygen saturation. She is ill appearing but nontoxic. Appears well-hydrated. Her neurological exam is normal as documented. She has no meningismus, negative Kernig and negative Brudzinski. No petechiae or ecchymosis. No rash. Chest is clear to auscultation. She has clear rhinorrhea. No sinus tenderness. No lymphadenopathy.  My impression is that she most likely has a viral illness. However given that she's had one week of symptoms, is worsening, and had fever to as high as 104 this morning, feel that further evaluation is warranted. Her chest x-ray was normal and showed no pneumonia  at urgent care. I reviewed her labs from urgent care note leukocytosis. She had essentially normal metabolic panel.  Her UA is not suggestive of UTI. Obtained a CT head and sinuses. This demonstrates no intracranial abnormality. She has an  impacted molar on the right side with no evidence of sinusitis. Spoke to the patient about this and she states that she's had this for a long time, the tooth is not painful and she's not having any right maxillary sinus pain or tenderness. She is to see a dentist or oral surgeon about this, however I do not feel this is likely contributing to her presentation today. Influenza negative.  Performed a lumbar puncture to evaluate for meningitis, however my suspicion is relatively low. Procedure was uncomplicated  CSF results are normal, Gram stain negative, no organisms seen. Patient will be discharged home. Although I favor a viral illness, her symptoms have been prolonged, she does have a productive cough, could clinically be a pneumonia though chest x-ray negative. She's given five-day course of azithromycin and encouraged to follow up with her primary care physician on Monday for reevaluation. She's given emergency Department return precautions.    Final diagnoses:  Febrile illness  Cough  Headache  Acute URI       Wendall Papa, MD 07/23/13 0025

## 2013-07-22 NOTE — ED Notes (Signed)
MD at bedside. 

## 2013-07-22 NOTE — ED Provider Notes (Signed)
CSN: [id number]     Arrival date & time 07/22/13  1153 History   First MD Initiated Contact with Patient 07/22/13 1413     Chief Complaint  Patient presents with  . Fever   (Consider location/radiation/quality/duration/timing/severity/associated sxs/prior Treatment) HPI Comments: 58 year old female presents complaining of sore throat, headache, fever, body aches, nausea. Her symptoms initially began 8 days ago with sore throat. She saw her primary care physician 5 days ago and was diagnosed with a viral URI. Her symptoms seemed to get better, but then all came back. She body aches yesterday and a fever of 104F this morning. She has a severe headache, body aches, and feels cold. She says she feels similar to when she had influenza back in January. She has nausea but no vomiting. She denies any injuries or skin infections. No recent travel or sick contacts. Her cough is mild. No shortness of breath or pleuritic chest pain.  Patient is a 58 y.o. female presenting with fever.  Fever Associated symptoms: cough, headaches, myalgias, nausea and sore throat   Associated symptoms: no chest pain, no chills, no diarrhea, no dysuria, no ear pain, no rash and no vomiting     Past Medical History  Diagnosis Date  . Myalgia   . Hypertension   . Chronic renal insufficiency     in the setting of a solitary functioning left kidney with polycystic kidney disease  and hypertension  . History of kidney stones   . Anxiety and depression   . H/O chest pain   . Diabetes mellitus   . HA (headache)     hx of   Past Surgical History  Procedure Laterality Date  . Cholecystectomy  2002    Dr. Alphonsa Overall  . Cardiac catheterization  02/04/2006    normal  . Other surgical history      childbirth x 2  . Shoulder surgery     Family History  Problem Relation Age of Onset  . Diabetes Mother   . Hypertension Mother   . Diabetes Father   . Hypertension Father    History  Substance Use Topics  . Smoking  status: Never Smoker   . Smokeless tobacco: Not on file  . Alcohol Use:    OB History   Grav Para Term Preterm Abortions TAB SAB Ect Mult Living                 Review of Systems  Constitutional: Positive for fever and fatigue. Negative for chills.  HENT: Positive for sore throat. Negative for ear pain and sinus pressure.   Eyes: Negative for visual disturbance.  Respiratory: Positive for cough. Negative for chest tightness and shortness of breath.   Cardiovascular: Negative for chest pain, palpitations and leg swelling.  Gastrointestinal: Positive for nausea. Negative for vomiting, abdominal pain, diarrhea and blood in stool.  Endocrine: Negative for polydipsia and polyuria.  Genitourinary: Negative for dysuria, urgency and frequency.  Musculoskeletal: Positive for myalgias. Negative for arthralgias, back pain and neck stiffness.  Skin: Negative for rash.  Neurological: Positive for headaches. Negative for dizziness, weakness and light-headedness.    Allergies  Codeine; Crestor; Lipitor; Ramipril; and Toprol xl  Home Medications   Current Outpatient Rx  Name  Route  Sig  Dispense  Refill  . Canagliflozin (INVOKANA) 100 MG TABS   Oral   Take 100 mg by mouth daily.         . carvedilol (COREG) 25 MG tablet   Oral   Take  37.5 mg by mouth 2 (two) times daily with a meal.         . fenofibrate micronized (LOFIBRA) 200 MG capsule   Oral   Take 200 mg by mouth daily before breakfast.         . glimepiride (AMARYL) 1 MG tablet   Oral   Take 1 mg by mouth 2 (two) times daily before a meal.         . losartan-hydrochlorothiazide (HYZAAR) 100-25 MG per tablet   Oral   Take 1 tablet by mouth daily.         . metFORMIN (GLUMETZA) 500 MG (MOD) 24 hr tablet   Oral   Take 500 mg by mouth 2 (two) times daily with a meal.           BP 123/81  Pulse 120  Temp(Src) 100.8 F (38.2 C) (Oral)  Resp 18  SpO2 96% Physical Exam  Nursing note and vitals  reviewed. Constitutional: She is oriented to person, place, and time. Vital signs are normal. She appears well-developed and well-nourished. No distress.  HENT:  Head: Normocephalic and atraumatic.  Right Ear: External ear normal.  Left Ear: External ear normal.  Nose: Nose normal.  Mouth/Throat: Oropharynx is clear and moist. No oropharyngeal exudate.  Eyes: Conjunctivae are normal. Right eye exhibits no discharge. Left eye exhibits no discharge.  Neck: Normal range of motion. Neck supple. No Brudzinski's sign and no Kernig's sign noted.  Cardiovascular: Regular rhythm, normal heart sounds and normal pulses.  Tachycardia present.   Pulmonary/Chest: Effort normal and breath sounds normal. No respiratory distress.  Abdominal: Soft. Bowel sounds are normal. She exhibits no distension and no mass. There is no tenderness. There is no rebound and no guarding.  Lymphadenopathy:    She has no cervical adenopathy.  Neurological: She is alert and oriented to person, place, and time. She has normal strength and normal reflexes. No cranial nerve deficit or sensory deficit. Coordination normal. GCS eye subscore is 4. GCS verbal subscore is 5. GCS motor subscore is 6.  Skin: Skin is warm and dry. No rash noted. She is not diaphoretic. No erythema. No pallor.  Psychiatric: She has a normal mood and affect. Judgment normal.    ED Course  Procedures (including critical care time) Labs Review Labs Reviewed  CBC WITH DIFFERENTIAL - Abnormal; Notable for the following:    WBC 18.6 (*)    Hemoglobin 15.1 (*)    Neutrophils Relative % 89 (*)    Neutro Abs 16.4 (*)    Lymphocytes Relative 5 (*)    Monocytes Absolute 1.2 (*)    All other components within normal limits  POCT URINALYSIS DIP (DEVICE) - Abnormal; Notable for the following:    Glucose, UA 500 (*)    Hgb urine dipstick SMALL (*)    All other components within normal limits  COMPREHENSIVE METABOLIC PANEL   Imaging Review Dg Chest 2  View  07/22/2013   CLINICAL DATA:  Body aches and fever.  EXAM: CHEST  2 VIEW  COMPARISON:  05/31/2012.  FINDINGS: The heart is enlarged but stable. There is mild tortuosity of the thoracic aorta. The lungs are clear of acute process. Minimal streaky basilar scarring changes or atelectasis. The bony thorax is intact.  IMPRESSION: No acute cardiopulmonary findings.   Electronically Signed   By: Kalman Jewels M.D.   On: 07/22/2013 14:07     MDM   1. Headache   2. Leukocytosis   3.  FUO (fever of unknown origin)    Pt with fever, body aches, nausea, and headache for 8 days; fever to 104 this AM; significant leukocytosis, unsure of source of infection.  Exam nonfocal, CXR read as normal, urine neg, CMP pending.  Cough is only mild, not convinced this is respiratory infection.  HA was 10/10, significant relief with toradol.  Transferred to ED for further eval.          Liam Graham, PA-C 07/22/13 1512

## 2013-07-22 NOTE — ED Notes (Signed)
The pt was [facility] here from [facility] with multiple complaints headache nv temp cough productive  White sputum .  Some lower back pain.  She has also seenher regular doctor that told her she had a virus  On Monday.  lmp none

## 2013-07-22 NOTE — ED Provider Notes (Signed)
Medical screening examination/treatment/procedure(s) were performed by resident physician or non-physician practitioner and as supervising physician I was immediately available for consultation/collaboration.   Pauline Good MD.   Billy Fischer, MD 07/22/13 [phone]

## 2013-07-22 NOTE — ED Notes (Signed)
Pt taken to CT.

## 2013-07-22 NOTE — ED Notes (Addendum)
Pt  Reports   Symptoms  Of  Body  Aches      With  Fever   For  About 1  Week          Pt  Was  Seen  By PCP     SEV DAYS  AGO       FOR  URI        PT  REPORTS  SYMPTOMS  ARE WORSE   TO  INCLUDE  A  COUGH  AT THIS  TIME      PT states  Had  A  Negative  Strep  Test       sev  Days  ago

## 2013-07-23 LAB — INFLUENZA PANEL BY PCR (TYPE A & B)
H1N1FLUPCR: NOT DETECTED
Influenza A By PCR: NEGATIVE
Influenza B By PCR: NEGATIVE

## 2013-07-23 MED ORDER — AZITHROMYCIN 250 MG PO TABS: 250.0000 mg | ORAL_TABLET | Freq: Every day | ORAL | Status: DC

## 2013-07-23 MED ORDER — AZITHROMYCIN 250 MG PO TABS
500.0000 mg | ORAL_TABLET | Freq: Once | ORAL | Status: AC
  Administered 2013-07-23: 500 mg via ORAL
  Filled 2013-07-23: qty 2

## 2013-07-24 LAB — CULTURE, GROUP A STREP

## 2013-07-25 NOTE — ED Provider Notes (Signed)
I saw and evaluated the patient, reviewed the resident's note and I agree with the findings and plan.   .Face to face Exam:  General:  Awake HEENT:  Atraumatic Neck: Kernig's and Brudzinski's sign are negative no meningeal findings. Resp:  Normal effort Abd:  Nondistended Neuro:No focal weakness Lymph: No adenopathy    Dot Lanes, MD 07/25/13 1517

## 2013-07-26 LAB — CSF CULTURE W GRAM STAIN
Culture: NO GROWTH
Gram Stain: NONE SEEN

## 2013-07-26 LAB — CSF CULTURE

## 2013-07-29 LAB — CULTURE, BLOOD (ROUTINE X 2)
CULTURE: NO GROWTH
CULTURE: NO GROWTH

## 2014-02-20 ENCOUNTER — Other Ambulatory Visit: Payer: Self-pay

## 2014-02-20 DIAGNOSIS — Z1231 Encounter for screening mammogram for malignant neoplasm of breast: Secondary | ICD-10-CM

## 2014-03-09 ENCOUNTER — Ambulatory Visit
Admission: RE | Admit: 2014-03-09 | Discharge: 2014-03-09 | Disposition: A | Discharge status: Home / Self Care | Payer: 59 | Source: Ambulatory Visit

## 2014-03-09 DIAGNOSIS — Z1231 Encounter for screening mammogram for malignant neoplasm of breast: Secondary | ICD-10-CM

## 2014-07-21 ENCOUNTER — Other Ambulatory Visit: Payer: Self-pay | Admitting: *Deleted

## 2014-07-21 ENCOUNTER — Encounter: Payer: Self-pay | Admitting: *Deleted

## 2014-07-21 NOTE — Patient Outreach (Signed)
[facility]) Care Management   07/21/2014  Madison Gill [birth year] [id number]  Madison Gill is an 59 y.o. female presenting for routine Type II DM Link To Wellness follow up.    Subjective:  States she feels great but has increased stress as her elderly Mom is now living with her and her husband permanently. She recently hired a caregiver to assist with her Mom's care weekdays from 12:30-3:00 p.m. She says she completed participation in the [employer] study and recently enrolled in the [place] sponsored [facility] digital care initiative for Type 2 diabetics. She says the study will last for 12 weeks. She says she checks her blood sugar three times daily, wears a fitness/sleep/pulse ox tracker and was provided a scale to weigh weekly as part of the study protocol.  Objective:   Review of Systems  All other systems reviewed and are negative.   Physical Exam  Constitutional: She is oriented to person, place, and time. She appears well-developed and well-nourished.  Neurological: She is alert and oriented to person, place, and time.  Psychiatric: She has a normal mood and affect. Her behavior is normal. Judgment and thought content normal.   Filed Weights   07/21/14 1407  Weight: 159 lb (72.122 kg)   Filed Vitals:   07/21/14 1407  BP: 138/82    Current Medications:   Current Outpatient Prescriptions  Medication Sig Dispense Refill  . aspirin EC 81 MG tablet Take 81 mg by mouth daily.    . Canagliflozin (INVOKANA) 100 MG TABS Take 100 mg by mouth daily.    . carvedilol (COREG) 25 MG tablet Take 25 mg by mouth 2 (two) times daily with a meal.     . Exenatide ER 2 MG PEN Inject into the skin.    . fenofibrate micronized (LOFIBRA) 200 MG capsule Take 200 mg by mouth daily before breakfast.    . metFORMIN (GLUCOPHAGE) 500 MG tablet Take 500 mg by mouth 2 (two) times daily with a meal.    . azithromycin (ZITHROMAX Z-PAK) 250 MG tablet Take 1 tablet (250 mg total)  by mouth daily. (Patient not taking: Reported on 07/21/2014) 4 tablet 0  . guaiFENesin (MUCINEX) 600 MG 12 hr tablet Take 600 mg by mouth 2 (two) times daily as needed for cough or to loosen phlegm.    Marland Kitchen guaiFENesin-codeine (ROBITUSSIN AC) 100-10 MG/5ML syrup Take 5 mLs by mouth 3 (three) times daily as needed for cough.    . losartan-hydrochlorothiazide (HYZAAR) 100-25 MG per tablet Take 1 tablet by mouth daily.     No current facility-administered medications for this visit.    Functional Status:   In your present state of health, do you have any difficulty performing the following activities: 07/21/2014  Is the patient deaf or have difficulty hearing? N  Hearing N  Vision N  Difficulty concentrating or making decisions N  Walking or climbing stairs? N  Doing errands, shopping? N    Fall/Depression Screening:    PHQ 2/9 Scores 07/21/2014  PHQ - 2 Score 0   THN CM Care Plan        Patient Outreach from 07/21/2014 in [place] Problem One  Well controlled Type 2 DM with most recent A1C= 6.3% on 07/18/14   Care Plan for Problem One  Active   Interventions for Problem One Long Term Goal  reviewed Type 2 diabetes pathophysiology, reviewed CBG results, encouraged ongoing participation in the  [facility] digical care initiative for type 2 DM management assistance, encouraged  Madison Gill to log her daily walking into the Live [employer] Well website to earn her activity badge, encouraged Madison Gill to reward herself with an activity she enjoys to assist with stress reduction   THN Long Term Goal (31-90 days)  Ongoing good control of Type II as evidenced by A1C <6.5% at next check   [facility] Long Term Goal Start Date  07/21/14     Assessment:   Patient presents with good control of Type II DM, HTN, Hyperlipidemia as evidenced by recent labs at primary care MD office.  Plan:   RNCM to fax today's office visit note to Dr. Tollie Pizza. RNCM will meet quarterly and as needed with  patient per Link To Wellness program guidelines to assist with Type II DM self-management and assess patient's progress toward mutually set goals.  Barrington Ellison RN,CCM,CDE [place] Management Coordinator Office Phone [phone]

## 2014-08-09 ENCOUNTER — Other Ambulatory Visit: Payer: Self-pay | Admitting: Obstetrics and Gynecology

## 2014-08-11 LAB — CYTOLOGY - PAP

## 2014-08-31 ENCOUNTER — Encounter: Payer: Self-pay | Admitting: *Deleted

## 2014-09-12 ENCOUNTER — Encounter (HOSPITAL_COMMUNITY)
Admission: EM | Disposition: A | Discharge status: Home / Self Care | Payer: Self-pay | Source: Ambulatory Visit | Attending: Urology

## 2014-09-12 ENCOUNTER — Ambulatory Visit (HOSPITAL_COMMUNITY): Payer: 59 | Admitting: Certified Registered Nurse Anesthetist

## 2014-09-12 ENCOUNTER — Encounter (HOSPITAL_COMMUNITY): Payer: Self-pay | Admitting: *Deleted

## 2014-09-12 ENCOUNTER — Other Ambulatory Visit: Payer: Self-pay | Admitting: Urology

## 2014-09-12 ENCOUNTER — Ambulatory Visit (HOSPITAL_COMMUNITY)
Admission: EM | Admit: 2014-09-12 | Discharge: 2014-09-12 | Disposition: A | Discharge status: Home / Self Care | Payer: 59 | Source: Ambulatory Visit | Attending: Urology | Admitting: Urology

## 2014-09-12 DIAGNOSIS — E119 Type 2 diabetes mellitus without complications: Secondary | ICD-10-CM | POA: Insufficient documentation

## 2014-09-12 DIAGNOSIS — N200 Calculus of kidney: Secondary | ICD-10-CM | POA: Diagnosis present

## 2014-09-12 DIAGNOSIS — Z886 Allergy status to analgesic agent status: Secondary | ICD-10-CM | POA: Diagnosis not present

## 2014-09-12 DIAGNOSIS — N281 Cyst of kidney, acquired: Secondary | ICD-10-CM | POA: Diagnosis not present

## 2014-09-12 DIAGNOSIS — Z7982 Long term (current) use of aspirin: Secondary | ICD-10-CM | POA: Diagnosis not present

## 2014-09-12 DIAGNOSIS — Z888 Allergy status to other drugs, medicaments and biological substances status: Secondary | ICD-10-CM | POA: Insufficient documentation

## 2014-09-12 DIAGNOSIS — I10 Essential (primary) hypertension: Secondary | ICD-10-CM | POA: Diagnosis not present

## 2014-09-12 DIAGNOSIS — Z79899 Other long term (current) drug therapy: Secondary | ICD-10-CM | POA: Insufficient documentation

## 2014-09-12 DIAGNOSIS — N261 Atrophy of kidney (terminal): Secondary | ICD-10-CM | POA: Insufficient documentation

## 2014-09-12 HISTORY — PX: CYSTOSCOPY/URETEROSCOPY/HOLMIUM LASER/STENT PLACEMENT: SHX6546

## 2014-09-12 LAB — CBC
HCT: 44.5 % (ref 36.0–46.0)
Hemoglobin: 14.4 g/dL (ref 12.0–15.0)
MCH: 30.1 pg (ref 26.0–34.0)
MCHC: 32.4 g/dL (ref 30.0–36.0)
MCV: 92.9 fL (ref 78.0–100.0)
PLATELETS: 245 10*3/uL (ref 150–400)
RBC: 4.79 MIL/uL (ref 3.87–5.11)
RDW: 13.2 % (ref 11.5–15.5)
WBC: 7.6 10*3/uL (ref 4.0–10.5)

## 2014-09-12 LAB — BASIC METABOLIC PANEL
Anion gap: 10 (ref 5–15)
BUN: 32 mg/dL — ABNORMAL HIGH (ref 6–20)
CALCIUM: 10 mg/dL (ref 8.9–10.3)
CHLORIDE: 107 mmol/L (ref 101–111)
CO2: 26 mmol/L (ref 22–32)
Creatinine, Ser: 1.12 mg/dL — ABNORMAL HIGH (ref 0.44–1.00)
GFR calc Af Amer: >60 mL/min (ref >60)
GFR, EST NON AFRICAN AMERICAN: 53 mL/min — AB (ref >60)
Glucose, Bld: 105 mg/dL — ABNORMAL HIGH (ref 65–99)
Potassium: 4.4 mmol/L (ref 3.5–5.1)
SODIUM: 143 mmol/L (ref 135–145)

## 2014-09-12 LAB — GLUCOSE, CAPILLARY
GLUCOSE-CAPILLARY: 78 mg/dL (ref 65–99)
Glucose-Capillary: 99 mg/dL (ref 65–99)

## 2014-09-12 SURGERY — CYSTOSCOPY/URETEROSCOPY/HOLMIUM LASER/STENT PLACEMENT
Anesthesia: General | Site: Ureter | Laterality: Left

## 2014-09-12 MED ORDER — LACTATED RINGERS IV SOLN: INTRAVENOUS | Status: DC

## 2014-09-12 MED ORDER — LIDOCAINE HCL (CARDIAC) 20 MG/ML IV SOLN
INTRAVENOUS | Status: DC | PRN
  Administered 2014-09-12: 100 mg via INTRAVENOUS

## 2014-09-12 MED ORDER — TRAMADOL-ACETAMINOPHEN 37.5-325 MG PO TABS: 1.0000 | ORAL_TABLET | Freq: Four times a day (QID) | ORAL | Status: DC | PRN

## 2014-09-12 MED ORDER — KETOROLAC TROMETHAMINE 30 MG/ML IJ SOLN
INTRAMUSCULAR | Status: DC | PRN
  Administered 2014-09-12: 30 mg via INTRAVENOUS

## 2014-09-12 MED ORDER — ONDANSETRON HCL 4 MG/2ML IJ SOLN
INTRAMUSCULAR | Status: AC
  Filled 2014-09-12: qty 2

## 2014-09-12 MED ORDER — BELLADONNA ALKALOIDS-OPIUM 16.2-60 MG RE SUPP
RECTAL | Status: DC | PRN
  Administered 2014-09-12: 1 via RECTAL

## 2014-09-12 MED ORDER — IOHEXOL 300 MG/ML  SOLN
INTRAMUSCULAR | Status: DC | PRN
  Administered 2014-09-12: 10 mL via INTRAVENOUS

## 2014-09-12 MED ORDER — CEFAZOLIN SODIUM-DEXTROSE 2-3 GM-% IV SOLR
INTRAVENOUS | Status: AC
  Filled 2014-09-12: qty 50

## 2014-09-12 MED ORDER — ACETAMINOPHEN 10 MG/ML IV SOLN
1000.0000 mg | INTRAVENOUS | Status: AC
  Administered 2014-09-12: 1000 mg via INTRAVENOUS
  Filled 2014-09-12: qty 100

## 2014-09-12 MED ORDER — SODIUM CHLORIDE 0.9 % IR SOLN
Status: DC | PRN
  Administered 2014-09-12: 1000 mL

## 2014-09-12 MED ORDER — FENTANYL CITRATE (PF) 100 MCG/2ML IJ SOLN: 25.0000 ug | INTRAMUSCULAR | Status: DC | PRN

## 2014-09-12 MED ORDER — FENTANYL CITRATE (PF) 100 MCG/2ML IJ SOLN
INTRAMUSCULAR | Status: DC | PRN
  Administered 2014-09-12 (×2): 25 ug via INTRAVENOUS

## 2014-09-12 MED ORDER — CEFAZOLIN SODIUM-DEXTROSE 2-3 GM-% IV SOLR
INTRAVENOUS | Status: DC | PRN
  Administered 2014-09-12: 2 g via INTRAVENOUS

## 2014-09-12 MED ORDER — ONDANSETRON HCL 4 MG/2ML IJ SOLN
INTRAMUSCULAR | Status: DC | PRN
  Administered 2014-09-12: 4 mg via INTRAVENOUS

## 2014-09-12 MED ORDER — CEFAZOLIN SODIUM-DEXTROSE 2-3 GM-% IV SOLR: 2.0000 g | INTRAVENOUS | Status: DC

## 2014-09-12 MED ORDER — LACTATED RINGERS IV SOLN
INTRAVENOUS | Status: DC
  Administered 2014-09-12: 1000 mL via INTRAVENOUS
  Administered 2014-09-12: 17:00:00 via INTRAVENOUS

## 2014-09-12 MED ORDER — PHENAZOPYRIDINE HCL 200 MG PO TABS: 200.0000 mg | ORAL_TABLET | Freq: Three times a day (TID) | ORAL | Status: DC | PRN

## 2014-09-12 MED ORDER — TRIMETHOPRIM 100 MG PO TABS: 100.0000 mg | ORAL_TABLET | ORAL | Status: DC

## 2014-09-12 MED ORDER — OXYBUTYNIN CHLORIDE 5 MG PO TABS: ORAL_TABLET | ORAL | Status: DC

## 2014-09-12 MED ORDER — BELLADONNA ALKALOIDS-OPIUM 16.2-60 MG RE SUPP
RECTAL | Status: AC
  Filled 2014-09-12: qty 1

## 2014-09-12 MED ORDER — TRAMADOL-ACETAMINOPHEN 37.5-325 MG PO TABS
1.0000 | ORAL_TABLET | Freq: Once | ORAL | Status: DC
  Filled 2014-09-12: qty 1

## 2014-09-12 MED ORDER — FENTANYL CITRATE (PF) 100 MCG/2ML IJ SOLN
INTRAMUSCULAR | Status: AC
  Filled 2014-09-12: qty 2

## 2014-09-12 MED ORDER — PROPOFOL 10 MG/ML IV BOLUS
INTRAVENOUS | Status: DC | PRN
  Administered 2014-09-12: 150 mg via INTRAVENOUS

## 2014-09-12 MED ORDER — MIDAZOLAM HCL 2 MG/2ML IJ SOLN
INTRAMUSCULAR | Status: AC
  Filled 2014-09-12: qty 2

## 2014-09-12 MED ORDER — KETOROLAC TROMETHAMINE 30 MG/ML IJ SOLN
INTRAMUSCULAR | Status: AC
  Filled 2014-09-12: qty 1

## 2014-09-12 MED ORDER — MIDAZOLAM HCL 5 MG/5ML IJ SOLN
INTRAMUSCULAR | Status: DC | PRN
  Administered 2014-09-12: 2 mg via INTRAVENOUS

## 2014-09-12 SURGICAL SUPPLY — 19 items
BAG URO CATCHER STRL LF (DRAPE) ×2 IMPLANT
BASKET STNLS GEMINI 4WIRE 3FR (BASKET) IMPLANT
BASKET ZERO TIP NITINOL 2.4FR (BASKET) IMPLANT
CATH INTERMIT  6FR 70CM (CATHETERS) ×2 IMPLANT
CLOTH BEACON ORANGE TIMEOUT ST (SAFETY) ×2 IMPLANT
FIBER LASER FLEXIVA 1000 (UROLOGICAL SUPPLIES) IMPLANT
FIBER LASER FLEXIVA 200 (UROLOGICAL SUPPLIES) IMPLANT
FIBER LASER FLEXIVA 365 (UROLOGICAL SUPPLIES) IMPLANT
FIBER LASER FLEXIVA 550 (UROLOGICAL SUPPLIES) IMPLANT
FIBER LASER TRAC TIP (UROLOGICAL SUPPLIES) IMPLANT
GLOVE BIOGEL M STRL SZ7.5 (GLOVE) ×2 IMPLANT
GOWN STRL REUS W/TWL XL LVL3 (GOWN DISPOSABLE) ×2 IMPLANT
GUIDEWIRE STR DUAL SENSOR (WIRE) ×2 IMPLANT
IV NS 1000ML (IV SOLUTION) ×1
IV NS 1000ML BAXH (IV SOLUTION) ×1 IMPLANT
MANIFOLD NEPTUNE II (INSTRUMENTS) ×2 IMPLANT
PACK CYSTO (CUSTOM PROCEDURE TRAY) ×2 IMPLANT
STENT URET 6FRX24 CONTOUR (STENTS) ×2 IMPLANT
TUBING CONNECTING 10 (TUBING) ×2 IMPLANT

## 2014-09-12 NOTE — Op Note (Signed)
Pre-operative diagnosis :   Left UPJ stone (7 mm x 1 cm) Postoperative diagnosis:  same  Operation:  Cystourethroscopy, left retrograde pyelogram with interpretation, left double-J stent passage (6 [place] by 24 cm).  Surgeon:  Chauncey Cruel. Gaynelle Arabian, MD  First assistant:  none  Anesthesia:  General LMA  Preparation:  After appropriate pre-anesthesia, the patient was brought to the operative room, placed on the operating table in the dorsal supine position where general LMA anesthesia was introduced. She was then replaced in the dorsal lithotomy position with previous prepped with Betadine solution and draped in usual fashion. The left hand and the left flank were previously marked. X-rays were reviewed and history was reviewed.  Review history:  Lt flank pain   Active Problems Problems  1. 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 Left flank pain (R10.9)  Assessed By: Carolan Clines (Urology); Last Assessed: 12 Sep 2014 3. Nausea (R11.0)  Assessed By: Carolan Clines (Urology); Last Assessed: 12 Sep 2014 4. Nephrolithiasis (N20.0)  Assessed By: Jimmey Ralph (Urology); Last Assessed: 31 Aug 2014 5. 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 Sammie Bench) returns today for Lt flank pain associated with nausea/vomiting. ( Right kidney is present , but small, and non-functioning).    She was last seen by Jimmey Ralph, NP on 08/31/14 for Lt flank pain, tea colored urine, and nausea. CT showed a 7x60mm Lt renal pelvic stone. She has a pending appt with Dr. Tresa Moore to discuss treatment options.    Last seen 2014 with history of nephrolithiasis. KUB from Dec 2013 showed a 5 mm LLP stone and a 4 mm left midpole stone. She does have a known  atrophic right kidney and has a history of renal insufficiency.   Statement of  Likelihood of Success: Excellent. TIME-OUT observed.:  Procedure:  Cystourethroscopy, as, showing normal appearing urethra, normal bladder base with clear reflux from both orifices. Left retropyelogram is performed which shows normal-appearing ureter. There is a 1 7 m x 7 mm stone in the left renal pelvis, but which appears to be stuck at the ureteral pelvic junction. With manipulation of the 6 open-ended catheter, the stone is manipulated into the renal pelvis without difficulty. A guidewire was then passed into the renal pelvis, and over this guidewire, 6 French by 2 4 cm double-J stent was passed and coiled in place. The patient tolerated procedure well. Photodocumentation was accomplished. The patient was given IV Toradol and IV Tylenol. She was awakened and taken to recovery room in good condition.

## 2014-09-12 NOTE — Anesthesia Preprocedure Evaluation (Addendum)
Anesthesia Evaluation  Patient identified by MRN, date of birth, ID band Patient awake    Reviewed: Allergy & Precautions, H&P , NPO status , Patient's Chart, lab work & pertinent test results, reviewed documented beta blocker date and time   Airway Mallampati: II  TM Distance: >3 FB Neck ROM: full    Dental  (+) Chipped, Dental Advisory Given Right upper front chipped:   Pulmonary neg pulmonary ROS,  breath sounds clear to auscultation  Pulmonary exam normal       Cardiovascular Exercise Tolerance: Good hypertension, Pt. on home beta blockers and Pt. on medications Normal cardiovascular examRhythm:regular Rate:Normal     Neuro/Psych negative neurological ROS  negative psych ROS   GI/Hepatic negative GI ROS, Neg liver ROS,   Endo/Other  diabetes, Well Controlled, Type 2, Oral Hypoglycemic Agents  Renal/GU negative Renal ROS  negative genitourinary   Musculoskeletal   Abdominal   Peds  Hematology negative hematology ROS (+)   Anesthesia Other Findings   Reproductive/Obstetrics negative OB ROS                            Anesthesia Physical Anesthesia Plan  ASA: III  Anesthesia Plan: General   Post-op Pain Management:    Induction: Intravenous  Airway Management Planned: LMA  Additional Equipment:   Intra-op Plan:   Post-operative Plan:   Informed Consent: I have reviewed the patients History and Physical, chart, labs and discussed the procedure including the risks, benefits and alternatives for the proposed anesthesia with the patient or authorized representative who has indicated his/her understanding and acceptance.   Dental Advisory Given  Plan Discussed with: CRNA and Surgeon  Anesthesia Plan Comments:         Anesthesia Quick Evaluation

## 2014-09-12 NOTE — Transfer of Care (Signed)
Immediate Anesthesia Transfer of Care Note  Patient: Madison Gill  Procedure(s) Performed: Procedure(s): CYSTOSCOPY/LEFT RETROGRADE PYELOGRAM/LEFT URETERAL STENT PLACEMENT (Left)  Patient Location: PACU  Anesthesia Type:General  Level of Consciousness:  sedated, patient cooperative and responds to stimulation  Airway & Oxygen Therapy:Patient Spontanous Breathing and Patient connected to face mask oxgen  Post-op Assessment:  Report given to PACU RN and Post -op Vital signs reviewed and stable  Post vital signs:  Reviewed and stable  Last Vitals:  Filed Vitals:   09/12/14 1403  BP: 135/87  Pulse: 77  Temp: 36.4 C  Resp: 16    Complications: No apparent anesthesia complications

## 2014-09-12 NOTE — Interval H&P Note (Signed)
History and Physical Interval Note:  09/12/2014 5:15 PM  Madison Gill  has presented today for surgery, with the diagnosis of left ureteral calculus  The various methods of treatment have been discussed with the patient and family. After consideration of risks, benefits and other options for treatment, the patient has consented to  Procedure(s): CYSTOSCOPY/STENT PLACEMENT/POSSIBLE URETEROSCOPY/HOLMIUM LASER/ (Left) as a surgical intervention .  The patient's history has been reviewed, patient examined, no change in status, stable for surgery.  I have reviewed the patient's chart and labs.  Questions were answered to the patient's satisfaction.     Argie Lober I Rourke Mcquitty

## 2014-09-12 NOTE — Anesthesia Postprocedure Evaluation (Signed)
  Anesthesia Post-op Note  Patient: Madison Gill  Procedure(s) Performed: Procedure(s) (LRB): CYSTOSCOPY/LEFT RETROGRADE PYELOGRAM/LEFT URETERAL STENT PLACEMENT (Left)  Patient Location: PACU  Anesthesia Type: General  Level of Consciousness: awake and alert   Airway and Oxygen Therapy: Patient Spontanous Breathing  Post-op Pain: mild  Post-op Assessment: Post-op Vital signs reviewed, Patient's Cardiovascular Status Stable, Respiratory Function Stable, Patent Airway and No signs of Nausea or vomiting  Last Vitals:  Filed Vitals:   09/12/14 1800  BP: 134/91  Pulse: 86  Temp:   Resp: 11    Post-op Vital Signs: stable   Complications: No apparent anesthesia complications

## 2014-09-12 NOTE — H&P (Signed)
Reason For Visit Lt flank pain   Active Problems Problems  1. 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 Left flank pain (R10.9)   Assessed By: Carolan Clines (Urology); Last Assessed: 12 Sep 2014 3. Nausea (R11.0)   Assessed By: Carolan Clines (Urology); Last Assessed: 12 Sep 2014 4. Nephrolithiasis (N20.0)   Assessed By: Jimmey Ralph (Urology); Last Assessed: 31 Aug 2014 5. 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 Sammie Bench) returns today for Lt flank pain associated with nausea/vomiting. ( Right kidney is present , but small, and non-functioning).     She was last seen by Jimmey Ralph, NP on 08/31/14 for Lt flank pain, tea colored urine, and nausea. CT showed a 7x50m Lt renal pelvic stone. She has a pending appt with Dr. MTresa Mooreto discuss treatment options.    Last seen 2014 with history of nephrolithiasis. KUB from Dec 2013 showed a 5 mm LLP stone and a 4 mm left midpole stone. She does have a known atrophic right kidney and has a history of renal insufficiency.   Past Medical History Problems  1. History of Calculus of ureter (N20.1) 2. History of hypertension (Z86.79)  Surgical History Problems  1. History of Dilation And Curettage 2. History of Gallbladder Surgery 3. History of Shoulder Surgery  Current Meds 1. Aspirin 81 MG TABS;  Therapy: (Recorded:09Dec2013) to Recorded 2. Bydureon PEN;  Therapy: (Recorded:12May2016) to Recorded 3. Coreg TABS;  Therapy: (Recorded:05Feb2014) to Recorded 4. Fenofibrate 200 MG CAPS;  Therapy: (Recorded:09Dec2013) to Recorded 5. Hydrocodone-Acetaminophen 5-325 MG Oral Tablet; Take 1-2 tablets every 4-6 hours for  pain;  Therapy: [id number] (Last Rx:12May2016); Status:  ACTIVE - Retrospective  Authorization Ordered 6. Invokana 100 MG Oral Tablet;  Therapy: (Recorded:05Feb2014) to Recorded 7. Losartan Potassium 25 MG Oral Tablet;  Therapy: (Recorded:12May2016) to Recorded 8. MetFORMIN HCl - 500 MG Oral Tablet;  Therapy: [id number] Recorded 9. Ondansetron HCl - 4 MG Oral Tablet; Take 1 tablet as needed;  Therapy: 24Aug2009 to (Last Rx:11Feb2014)  Requested for: [id number] 10. Ondansetron HCl - 8 MG Oral Tablet; TAKE 1 TABLET Every 6 hours PRN;   Therapy: 108MVH8469to (Last Rx:12May2016)  Requested for: [id number] Status:   ACTIVE - Retrospective Authorization Ordered  Allergies Medication  1. Codeine Derivatives 2. HTN Complex CAPS 3. Lipitor TABS  Family History Problems  1. Family history of Diabetes Mellitus : Mother 2. Family history of Family Health Status Number Of Children   2 sons 3. Family history of Hypertension : Father 422 Family history of Ischemic Stroke : Father 513 Family history of Urologic Disorder : Father   kidney stones 6. Family history of Urologic Disorder : Brother   kidney stones  Social History Problems  1. Denied: Alcohol Use 2. Marital History - Currently Married 3. Never A Smoker 4. Occupation:   [employer]- [employer] 5. Denied: Tobacco Use  Review of Systems Genitourinary, constitutional, skin, eye, otolaryngeal, hematologic/lymphatic, cardiovascular, pulmonary, endocrine, musculoskeletal, gastrointestinal, neurological and psychiatric system(s) were reviewed and pertinent findings if present are noted and are otherwise negative.  Genitourinary: hematuria.  Gastrointestinal: nausea, vomiting, flank pain and abdominal pain.    Vitals Vital Signs [Data Includes: Last 1 Day]  Recorded: 224MWN027210:03AM  Blood Pressure: 129 / 84 Temperature: 98.2  F Heart Rate: 66  Physical Exam Constitutional: Well nourished and well developed . No acute distress.  ENT:. The ears and nose are normal in appearance.   Neck: The appearance of the neck is normal and no neck mass is present.  Pulmonary: No respiratory distress and normal respiratory rhythm and effort.  Cardiovascular: Heart rate and rhythm are normal . No peripheral edema.  Abdomen: The abdomen is soft and nontender. No masses are palpated. No CVA tenderness. No hernias are palpable. No hepatosplenomegaly noted.  Lymphatics: The femoral and inguinal nodes are not enlarged or tender.  Skin: Normal skin turgor, no visible rash and no visible skin lesions.  Neuro/Psych:. Mood and affect are appropriate.    Results/Data Urine [Data Includes: Last 1 Day]   [id number]  COLOR AMBER   APPEARANCE CLOUDY   SPECIFIC GRAVITY 1.010   pH 5.5   GLUCOSE > 1000 mg/dL  BILIRUBIN NEG   KETONE NEG mg/dL  BLOOD LARGE   PROTEIN NEG mg/dL  UROBILINOGEN 0.2 mg/dL  NITRITE NEG   LEUKOCYTE ESTERASE NEG   SQUAMOUS EPITHELIAL/HPF RARE   WBC NONE SEEN WBC/hpf  RBC TNTC RBC/hpf  BACTERIA NONE SEEN   CRYSTALS NONE SEEN   CASTS NONE SEEN   Selected Results  CREATININE with eGFR [id number] 10:24AM Carolan Clines  SPECIMEN TYPE: BLOOD   Test Name Result Flag Reference  CREATININE 0.90 mg/dL  0.50-1.40  Est GFR, African American 82 mL/min    Est GFR, NonAfrican American 71 mL/min    PERFORMED AT:        [facility]                      [address]                      [place], [place] [address]   THE ESTIMATED GFR IS A CALCULATION VALID FOR ADULTS (>=33 YEARS OLD) THAT USES THE CKD-EPI ALGORITHM TO ADJUST FOR AGE AND SEX. IT IS   NOT TO BE USED FOR CHILDREN, PREGNANT WOMEN, HOSPITALIZED PATIENTS,    PATIENTS ON DIALYSIS, OR WITH RAPIDLY CHANGING KIDNEY FUNCTION. ACCORDING TO THE NKDEP, EGFR >89 IS NORMAL, 60-89 SHOWS MILD IMPAIRMENT, 30-59 SHOWS MODERATE IMPAIRMENT, 15-29 SHOWS SEVERE IMPAIRMENT AND <15 IS ESRD.   AU CT-STONE PROTOCOL [id number] 12:00AM Jimmey Ralph   Test Name Result Flag Reference  CT-STONE PROTOCOL (Report)     ** RADIOLOGY REPORT BY [facility] **   CLINICAL DATA: Left flank pain for the past week. Diabetic. Known atrophic right kidney. Microscopic hematuria. History of renal stones. Nausea and vomiting.  EXAM: CT ABDOMEN AND PELVIS WITHOUT CONTRAST  TECHNIQUE: Multidetector CT imaging of the abdomen and pelvis was performed following the standard protocol without IV contrast.  COMPARISON: 05/30/2008  FINDINGS: Lower chest: Clear lung bases. Normal heart size without pericardial or pleural effusion.  Hepatobiliary: Scattered hepatic cysts. Minimal exclusion of the hepatic dome. Cholecystectomy, without biliary ductal dilatation.  Pancreas: Normal, without mass or ductal dilatation.  Spleen: Normal  Adrenals/Urinary Tract: Normal adrenal glands. Marked right renal atrophy with multiple collecting system calculi and fluid density lesions which are likely cysts.  Dominant 7 cm upper pole left renal fluid density lesion which is likely a cyst. Multiple left renal collecting system calculi, including an interpolar 8 mm stone.  Left renal pelvic 7x10 mm stone is new. No hydronephrosis. No hydroureter or ureteric calculi. No bladder calculi.  Stomach/Bowel: Normal stomach, without  wall thickening. Normal terminal ileum and appendix. Normal small bowel.  Vascular/Lymphatic: Normal caliber of the aorta and branch vessels. No abdominopelvic adenopathy.  Reproductive: Normal uterus and adnexa.  Other: No significant free fluid.  Musculoskeletal: Disc bulge at L3-4, L4-5, and L5-S1.  IMPRESSION: 1. Dominant left renal pelvic 7 x 10 mm stone, without significant urinary tract obstruction. 2. Bilateral nephrolithiasis with right renal atrophy and low-density renal lesions which are likely cysts.   Electronically Signed  By: Abigail Miyamoto M.D.  On: 08/31/2014 10:17   Procedure KUB: 1 cm stone now migrated from the Left renal UPJ into the L mid ureter. No right  kidney seen.     Assessment Assessed  1. 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 Calculus of left ureter (N20.1) 3. Solitary kidney, acquired (Z90.5) 4. Nausea (R11.0) 5. Left flank pain (R10.9)  59 yo diabetic female with solitary ( L) kidney, and now 1cm L mid-ureteral stone post Toradol this AM. ( normal Cr/gfr). She is pain free now, but, in view of solitary kidney, I think she needs a JJ stent tonight.   Plan Health Maintenance  1. UA With REFLEX; [Do Not Release]; Status:Resulted - Requires Verification;   Done:  [id number] 09:19AM Left flank pain  2. Administered: Ketorolac Tromethamine 60 MG/2ML Injection Solution 3. VENIPUNCTURE; Status:Complete;   Done: [id number] Left flank pain, Nephrolithiasis, Solitary kidney, acquired  4. KUB; Status:Resulted - Requires Verification;   Done: [id number] 10:33AM Left flank pain, Solitary kidney, acquired  5. CREATININE with eGFR; Status:Resulted - Requires Verification;   Done: [id number]  10:24AM  JJ stent as emergency add-on tonight. 45F x 24cm JJ stent.   Discussion/Summary cc: Dr. Jerilynn Birkenhead, [facility]     Signatures Electronically signed by : Carolan Clines, M.D.; Sep 12 2014 11:35AM EST

## 2014-09-12 NOTE — Discharge Instructions (Addendum)
Kidney Stones Kidney stones (urolithiasis) are solid masses that form inside your kidneys. The intense pain is caused by the stone moving through the kidney, ureter, bladder, and urethra (urinary tract). When the stone moves, the ureter starts to spasm around the stone. The stone is usually passed in your pee (urine).  HOME CARE  Drink enough fluids to keep your pee clear or pale yellow. This helps to get the stone out.  Strain all pee through the provided strainer. Do not pee without peeing through the strainer, not even once. If you pee the stone out, catch it in the strainer. The stone may be as small as a grain of salt. Take this to your doctor. This will help your doctor figure out what you can do to try to prevent more kidney stones.  Only take medicine as told by your doctor.  Follow up with your doctor as told.  Get follow-up X-rays as told by your doctor. GET HELP IF: You have pain that gets worse even if you have been taking pain medicine. GET HELP RIGHT AWAY IF:   Your pain does not get better with medicine.  You have a fever or shaking chills.  Your pain increases and gets worse over 18 hours.  You have new belly (abdominal) pain.  You feel faint or pass out.  You are unable to pee. MAKE SURE YOU:   Understand these instructions.  Will watch your condition.  Will get help right away if you are not doing well or get worse. Document Released: 09/24/2007 Document Revised: 12/08/2012 Document Reviewed: 09/08/2012 [facility] Patient Information 2015 [place], [place]. This information is not intended to replace advice given to you by your health care provider. Make sure you discuss any questions you have with your health care provider.        General Anesthesia, Care After Refer to this sheet in the next few weeks. These instructions provide you with information on caring for yourself after your procedure. Your health care provider may also give you more specific  instructions. Your treatment has been planned according to current medical practices, but problems sometimes occur. Call your health care provider if you have any problems or questions after your procedure. WHAT TO EXPECT AFTER THE PROCEDURE After the procedure, it is typical to experience:  Sleepiness.  Nausea and vomiting. HOME CARE INSTRUCTIONS  For the first 24 hours after general anesthesia:  Have a responsible person with you.  Do not drive a car. If you are alone, do not take public transportation.  Do not drink alcohol.  Do not take medicine that has not been prescribed by your health care provider.  Do not sign important papers or make important decisions.  You may resume a normal diet and activities as directed by your health care provider.  Change bandages (dressings) as directed.  If you have questions or problems that seem related to general anesthesia, call the hospital and ask for the anesthetist or anesthesiologist on call. SEEK MEDICAL CARE IF:  You have nausea and vomiting that continue the day after anesthesia.  You develop a rash. SEEK IMMEDIATE MEDICAL CARE IF:   You have difficulty breathing.  You have chest pain.  You have any allergic problems. Document Released: 07/14/2000 Document Revised: 04/12/2013 Document Reviewed: 10/21/2012 [facility] Patient Information 2015 [place], [place]. This information is not intended to replace advice given to you by your health care provider. Make sure you discuss any questions you have with your health care provider.

## 2014-09-12 NOTE — Anesthesia Procedure Notes (Signed)
Procedure Name: LMA Insertion Date/Time: 09/12/2014 5:25 PM Performed by: Maxwell Caul Pre-anesthesia Checklist: Patient identified, Emergency Drugs available, Suction available and Patient being monitored Patient Re-evaluated:Patient Re-evaluated prior to inductionOxygen Delivery Method: Circle system utilized Preoxygenation: Pre-oxygenation with 100% oxygen Intubation Type: IV induction LMA: LMA inserted LMA Size: 4.0 Number of attempts: 1 Tube secured with: Tape Dental Injury: Teeth and Oropharynx as per pre-operative assessment

## 2014-09-13 ENCOUNTER — Encounter (HOSPITAL_COMMUNITY): Payer: Self-pay | Admitting: Urology

## 2014-10-02 ENCOUNTER — Other Ambulatory Visit: Payer: Self-pay | Admitting: Urology

## 2014-10-11 NOTE — Patient Instructions (Addendum)
YOUR PROCEDURE IS SCHEDULED ON : 10/12/14  REPORT TO [facility]TR[facility] FOLLOW SIGNS TO SHORT STAY CENTER AT : 5:30 AM  CALL THIS NUMBER IF YOU HAVE PROBLEMS THE MORNING OF SURGERY [phone]  REMEMBER:ONLY 1 PER PERSON MAY GO TO SHORT STAY WITH YOU TO GET READY THE MORNING OF YOUR SURGERY  DO NOT EAT FOOD OR DRINK LIQUIDS AFTER MIDNIGHT  TAKE THESE MEDICINES THE MORNING OF SURGERY: CARVEDILOL / FENFIBRATE / TRAMADOL IF NEEDED  YOU MAY NOT HAVE ANY METAL ON YOUR BODY INCLUDING HAIR PINS AND PIERCING'S. DO NOT WEAR JEWELRY, MAKEUP, LOTIONS, POWDERS OR PERFUMES. DO NOT WEAR NAIL POLISH. DO NOT SHAVE 48 HRS PRIOR TO SURGERY. MEN MAY SHAVE FACE AND NECK.  DO NOT Greasy. [facility] IS NOT RESPONSIBLE FOR VALUABLES.  CONTACTS, DENTURES OR PARTIALS MAY NOT BE WORN TO SURGERY. LEAVE SUITCASE IN CAR. CAN BE BROUGHT TO ROOM AFTER SURGERY.  PATIENTS DISCHARGED THE DAY OF SURGERY WILL NOT BE ALLOWED TO DRIVE HOME.  PLEASE READ OVER THE FOLLOWING INSTRUCTION SHEETS _________________________________________________________________________________                                          [facility] - PREPARING FOR SURGERY  Before surgery, you can play an important role.  Because skin is not sterile, your skin needs to be as free of germs as possible.  You can reduce the number of germs on your skin by washing with CHG (chlorahexidine gluconate) soap before surgery.  CHG is an antiseptic cleaner which kills germs and bonds with the skin to continue killing germs even after washing. Please DO NOT use if you have an allergy to CHG or antibacterial soaps.  If your skin becomes reddened/irritated stop using the CHG and inform your nurse when you arrive at Short Stay. Do not shave (including legs and underarms) for at least 48 hours prior to the first CHG shower.  You may shave your face. Please follow these instructions carefully:   1.   Shower with CHG Soap the night before surgery and the  morning of Surgery.   2.  If you choose to wash your hair, wash your hair first as usual with your  normal  Shampoo.   3.  After you shampoo, rinse your hair and body thoroughly to remove the  shampoo.                                         4.  Use CHG as you would any other liquid soap.  You can apply chg directly  to the skin and wash . Gently wash with scrungie or clean wascloth    5.  Apply the CHG Soap to your body ONLY FROM THE NECK DOWN.   Do not use on open                           Wound or open sores. Avoid contact with eyes, ears mouth and genitals (private parts).                        Genitals (private parts) with your  normal soap.              6.  Wash thoroughly, paying special attention to the area where your surgery  will be performed.   7.  Thoroughly rinse your body with warm water from the neck down.   8.  DO NOT shower/wash with your normal soap after using and rinsing off  the CHG Soap .                9.  Pat yourself dry with a clean towel.             10.  Wear clean night clothes to bed after shower             11.  Place clean sheets on your bed the night of your first shower and do not  sleep with pets.  Day of Surgery : Do not apply any lotions/deodorants the morning of surgery.  Please wear clean clothes to the hospital/surgery center.  FAILURE TO FOLLOW THESE INSTRUCTIONS MAY RESULT IN THE CANCELLATION OF YOUR SURGERY    PATIENT SIGNATURE_________________________________  ______________________________________________________________________

## 2014-10-12 ENCOUNTER — Encounter (HOSPITAL_COMMUNITY)
Admission: RE | Admit: 2014-10-12 | Discharge: 2014-10-12 | Disposition: A | Discharge status: Home / Self Care | Payer: 59 | Source: Ambulatory Visit | Attending: Urology | Admitting: Urology

## 2014-10-12 ENCOUNTER — Encounter (HOSPITAL_COMMUNITY): Payer: Self-pay

## 2014-10-12 DIAGNOSIS — Z791 Long term (current) use of non-steroidal anti-inflammatories (NSAID): Secondary | ICD-10-CM | POA: Diagnosis not present

## 2014-10-12 DIAGNOSIS — Z7982 Long term (current) use of aspirin: Secondary | ICD-10-CM | POA: Diagnosis not present

## 2014-10-12 DIAGNOSIS — I129 Hypertensive chronic kidney disease with stage 1 through stage 4 chronic kidney disease, or unspecified chronic kidney disease: Secondary | ICD-10-CM | POA: Diagnosis not present

## 2014-10-12 DIAGNOSIS — Q613 Polycystic kidney, unspecified: Secondary | ICD-10-CM | POA: Diagnosis not present

## 2014-10-12 DIAGNOSIS — Z87442 Personal history of urinary calculi: Secondary | ICD-10-CM | POA: Diagnosis not present

## 2014-10-12 DIAGNOSIS — N189 Chronic kidney disease, unspecified: Secondary | ICD-10-CM | POA: Diagnosis not present

## 2014-10-12 DIAGNOSIS — E1122 Type 2 diabetes mellitus with diabetic chronic kidney disease: Secondary | ICD-10-CM | POA: Diagnosis not present

## 2014-10-12 DIAGNOSIS — N200 Calculus of kidney: Secondary | ICD-10-CM | POA: Diagnosis present

## 2014-10-12 DIAGNOSIS — Z79899 Other long term (current) drug therapy: Secondary | ICD-10-CM | POA: Diagnosis not present

## 2014-10-12 DIAGNOSIS — E781 Pure hyperglyceridemia: Secondary | ICD-10-CM | POA: Diagnosis not present

## 2014-10-12 HISTORY — DX: Pure hyperglyceridemia: E78.1

## 2014-10-12 LAB — CBC
HCT: 43.4 % (ref 36.0–46.0)
Hemoglobin: 14.2 g/dL (ref 12.0–15.0)
MCH: 30.3 pg (ref 26.0–34.0)
MCHC: 32.7 g/dL (ref 30.0–36.0)
MCV: 92.5 fL (ref 78.0–100.0)
PLATELETS: 283 10*3/uL (ref 150–400)
RBC: 4.69 MIL/uL (ref 3.87–5.11)
RDW: 13.4 % (ref 11.5–15.5)
WBC: 6.3 10*3/uL (ref 4.0–10.5)

## 2014-10-12 LAB — BASIC METABOLIC PANEL
Anion gap: 10 (ref 5–15)
BUN: 27 mg/dL — ABNORMAL HIGH (ref 6–20)
CALCIUM: 10.2 mg/dL (ref 8.9–10.3)
CO2: 27 mmol/L (ref 22–32)
Chloride: 103 mmol/L (ref 101–111)
Creatinine, Ser: 1.02 mg/dL — ABNORMAL HIGH (ref 0.44–1.00)
GFR calc Af Amer: >60 mL/min (ref >60)
GFR, EST NON AFRICAN AMERICAN: 59 mL/min — AB (ref >60)
GLUCOSE: 182 mg/dL — AB (ref 65–99)
Potassium: 4.3 mmol/L (ref 3.5–5.1)
SODIUM: 140 mmol/L (ref 135–145)

## 2014-10-12 MED ORDER — GENTAMICIN SULFATE 40 MG/ML IJ SOLN
5.0000 mg/kg | INTRAVENOUS | Status: AC
  Administered 2014-10-13: 350 mg via INTRAVENOUS
  Filled 2014-10-12 (×2): qty 8.75

## 2014-10-12 NOTE — Progress Notes (Signed)
Abnormal BMET faxed to Dr. Manny 

## 2014-10-13 ENCOUNTER — Encounter (HOSPITAL_COMMUNITY)
Admission: RE | Disposition: A | Discharge status: Home / Self Care | Payer: Self-pay | Source: Ambulatory Visit | Attending: Urology

## 2014-10-13 ENCOUNTER — Encounter (HOSPITAL_COMMUNITY): Payer: Self-pay | Admitting: *Deleted

## 2014-10-13 ENCOUNTER — Ambulatory Visit (HOSPITAL_COMMUNITY): Payer: 59 | Admitting: Certified Registered Nurse Anesthetist

## 2014-10-13 ENCOUNTER — Ambulatory Visit (HOSPITAL_COMMUNITY)
Admission: RE | Admit: 2014-10-13 | Discharge: 2014-10-13 | Disposition: A | Discharge status: Home / Self Care | Payer: 59 | Source: Ambulatory Visit | Attending: Urology | Admitting: Urology

## 2014-10-13 DIAGNOSIS — Q613 Polycystic kidney, unspecified: Secondary | ICD-10-CM | POA: Insufficient documentation

## 2014-10-13 DIAGNOSIS — N200 Calculus of kidney: Secondary | ICD-10-CM | POA: Insufficient documentation

## 2014-10-13 DIAGNOSIS — I129 Hypertensive chronic kidney disease with stage 1 through stage 4 chronic kidney disease, or unspecified chronic kidney disease: Secondary | ICD-10-CM | POA: Insufficient documentation

## 2014-10-13 DIAGNOSIS — N189 Chronic kidney disease, unspecified: Secondary | ICD-10-CM | POA: Insufficient documentation

## 2014-10-13 DIAGNOSIS — E781 Pure hyperglyceridemia: Secondary | ICD-10-CM | POA: Insufficient documentation

## 2014-10-13 DIAGNOSIS — Z791 Long term (current) use of non-steroidal anti-inflammatories (NSAID): Secondary | ICD-10-CM | POA: Insufficient documentation

## 2014-10-13 DIAGNOSIS — E1122 Type 2 diabetes mellitus with diabetic chronic kidney disease: Secondary | ICD-10-CM | POA: Insufficient documentation

## 2014-10-13 DIAGNOSIS — Z87442 Personal history of urinary calculi: Secondary | ICD-10-CM | POA: Insufficient documentation

## 2014-10-13 DIAGNOSIS — Z79899 Other long term (current) drug therapy: Secondary | ICD-10-CM | POA: Insufficient documentation

## 2014-10-13 DIAGNOSIS — Z7982 Long term (current) use of aspirin: Secondary | ICD-10-CM | POA: Insufficient documentation

## 2014-10-13 HISTORY — PX: HOLMIUM LASER APPLICATION: SHX5852

## 2014-10-13 HISTORY — PX: CYSTOSCOPY WITH RETROGRADE PYELOGRAM, URETEROSCOPY AND STENT PLACEMENT: SHX5789

## 2014-10-13 LAB — GLUCOSE, CAPILLARY
GLUCOSE-CAPILLARY: 98 mg/dL (ref 65–99)
Glucose-Capillary: 136 mg/dL — ABNORMAL HIGH (ref 65–99)

## 2014-10-13 SURGERY — CYSTOURETEROSCOPY, WITH RETROGRADE PYELOGRAM AND STENT INSERTION
Anesthesia: General | Site: Ureter | Laterality: Left

## 2014-10-13 MED ORDER — FENTANYL CITRATE (PF) 100 MCG/2ML IJ SOLN
INTRAMUSCULAR | Status: AC
  Filled 2014-10-13: qty 2

## 2014-10-13 MED ORDER — OXYCODONE-ACETAMINOPHEN 5-325 MG PO TABS: 1.0000 | ORAL_TABLET | ORAL | Status: DC | PRN

## 2014-10-13 MED ORDER — FENTANYL CITRATE (PF) 100 MCG/2ML IJ SOLN
INTRAMUSCULAR | Status: DC | PRN
  Administered 2014-10-13: 50 ug via INTRAVENOUS

## 2014-10-13 MED ORDER — LIDOCAINE HCL 2 % EX GEL
CUTANEOUS | Status: AC
  Filled 2014-10-13: qty 10

## 2014-10-13 MED ORDER — ONDANSETRON HCL 4 MG/2ML IJ SOLN
INTRAMUSCULAR | Status: AC
  Filled 2014-10-13: qty 2

## 2014-10-13 MED ORDER — FENTANYL CITRATE (PF) 100 MCG/2ML IJ SOLN: 25.0000 ug | INTRAMUSCULAR | Status: DC | PRN

## 2014-10-13 MED ORDER — CEPHALEXIN 500 MG PO CAPS: 500.0000 mg | ORAL_CAPSULE | Freq: Two times a day (BID) | ORAL | Status: DC

## 2014-10-13 MED ORDER — BELLADONNA ALKALOIDS-OPIUM 16.2-60 MG RE SUPP
RECTAL | Status: AC
  Filled 2014-10-13: qty 1

## 2014-10-13 MED ORDER — EPHEDRINE SULFATE 50 MG/ML IJ SOLN
INTRAMUSCULAR | Status: DC | PRN
  Administered 2014-10-13 (×5): 10 mg via INTRAVENOUS

## 2014-10-13 MED ORDER — MIDAZOLAM HCL 5 MG/5ML IJ SOLN
INTRAMUSCULAR | Status: DC | PRN
  Administered 2014-10-13: 2 mg via INTRAVENOUS

## 2014-10-13 MED ORDER — EPHEDRINE SULFATE 50 MG/ML IJ SOLN
INTRAMUSCULAR | Status: AC
  Filled 2014-10-13: qty 1

## 2014-10-13 MED ORDER — PROPOFOL 10 MG/ML IV BOLUS
INTRAVENOUS | Status: AC
  Filled 2014-10-13: qty 20

## 2014-10-13 MED ORDER — PHENYLEPHRINE HCL 10 MG/ML IJ SOLN
INTRAMUSCULAR | Status: DC | PRN
  Administered 2014-10-13 (×3): 80 ug via INTRAVENOUS

## 2014-10-13 MED ORDER — PROMETHAZINE HCL 25 MG/ML IJ SOLN: 6.2500 mg | INTRAMUSCULAR | Status: DC | PRN

## 2014-10-13 MED ORDER — SENNOSIDES-DOCUSATE SODIUM 8.6-50 MG PO TABS: 1.0000 | ORAL_TABLET | Freq: Two times a day (BID) | ORAL | Status: DC

## 2014-10-13 MED ORDER — ONDANSETRON HCL 4 MG/2ML IJ SOLN
INTRAMUSCULAR | Status: DC | PRN
  Administered 2014-10-13: 4 mg via INTRAVENOUS

## 2014-10-13 MED ORDER — MEPERIDINE HCL 50 MG/ML IJ SOLN: 6.2500 mg | INTRAMUSCULAR | Status: DC | PRN

## 2014-10-13 MED ORDER — LACTATED RINGERS IV SOLN
INTRAVENOUS | Status: DC | PRN
  Administered 2014-10-13 (×2): via INTRAVENOUS

## 2014-10-13 MED ORDER — PROPOFOL 10 MG/ML IV BOLUS
INTRAVENOUS | Status: DC | PRN
  Administered 2014-10-13: 200 mg via INTRAVENOUS

## 2014-10-13 MED ORDER — PHENYLEPHRINE 40 MCG/ML (10ML) SYRINGE FOR IV PUSH (FOR BLOOD PRESSURE SUPPORT)
PREFILLED_SYRINGE | INTRAVENOUS | Status: AC
  Filled 2014-10-13: qty 10

## 2014-10-13 MED ORDER — MIDAZOLAM HCL 2 MG/2ML IJ SOLN
INTRAMUSCULAR | Status: AC
  Filled 2014-10-13: qty 2

## 2014-10-13 MED ORDER — OXYCODONE-ACETAMINOPHEN 5-325 MG PO TABS
1.0000 | ORAL_TABLET | ORAL | Status: DC | PRN
  Administered 2014-10-13: 1 via ORAL
  Filled 2014-10-13: qty 1

## 2014-10-13 MED ORDER — SODIUM CHLORIDE 0.9 % IJ SOLN
INTRAMUSCULAR | Status: AC
  Filled 2014-10-13: qty 10

## 2014-10-13 MED ORDER — 0.9 % SODIUM CHLORIDE (POUR BTL) OPTIME
TOPICAL | Status: DC | PRN
  Administered 2014-10-13: 1000 mL

## 2014-10-13 MED ORDER — LIDOCAINE HCL (CARDIAC) 20 MG/ML IV SOLN
INTRAVENOUS | Status: DC | PRN
  Administered 2014-10-13: 50 mg via INTRAVENOUS

## 2014-10-13 MED ORDER — SODIUM CHLORIDE 0.9 % IR SOLN
Status: DC | PRN
  Administered 2014-10-13: 4000 mL

## 2014-10-13 MED ORDER — IOHEXOL 300 MG/ML  SOLN
INTRAMUSCULAR | Status: DC | PRN
  Administered 2014-10-13: 10 mL

## 2014-10-13 MED ORDER — LACTATED RINGERS IV SOLN: INTRAVENOUS | Status: DC

## 2014-10-13 SURGICAL SUPPLY — 23 items
BASKET LASER NITINOL 1.9FR (BASKET) ×6 IMPLANT
BASKET STNLS GEMINI 4WIRE 3FR (BASKET) IMPLANT
BASKET ZERO TIP NITINOL 2.4FR (BASKET) IMPLANT
CATH INTERMIT  6FR 70CM (CATHETERS) IMPLANT
CLOTH BEACON ORANGE TIMEOUT ST (SAFETY) ×3 IMPLANT
ELECT REM PT RETURN 9FT ADLT (ELECTROSURGICAL)
ELECTRODE REM PT RTRN 9FT ADLT (ELECTROSURGICAL) IMPLANT
FIBER LASER FLEXIVA 1000 (UROLOGICAL SUPPLIES) IMPLANT
FIBER LASER FLEXIVA 200 (UROLOGICAL SUPPLIES) ×3 IMPLANT
FIBER LASER FLEXIVA 365 (UROLOGICAL SUPPLIES) IMPLANT
FIBER LASER FLEXIVA 550 (UROLOGICAL SUPPLIES) IMPLANT
FIBER LASER TRAC TIP (UROLOGICAL SUPPLIES) ×3 IMPLANT
GLOVE BIOGEL M STRL SZ7.5 (GLOVE) ×3 IMPLANT
GOWN STRL REUS W/TWL XL LVL3 (GOWN DISPOSABLE) ×6 IMPLANT
GUIDEWIRE ANG ZIPWIRE 038X150 (WIRE) ×3 IMPLANT
GUIDEWIRE STR DUAL SENSOR (WIRE) ×3 IMPLANT
IV NS IRRIG 3000ML ARTHROMATIC (IV SOLUTION) ×3 IMPLANT
PACK CYSTO (CUSTOM PROCEDURE TRAY) ×3 IMPLANT
SHEATH ACCESS URETERAL 24CM (SHEATH) ×3 IMPLANT
STENT URET 6FRX24 CONTOUR (STENTS) ×3 IMPLANT
SYRINGE 10CC LL (SYRINGE) IMPLANT
SYRINGE IRR TOOMEY STRL 70CC (SYRINGE) IMPLANT
TUBE FEEDING 8FR 16IN STR KANG (MISCELLANEOUS) ×3 IMPLANT

## 2014-10-13 NOTE — Op Note (Signed)
Madison, Gill NO.:  [id number]  MEDICAL RECORD NO.:  [id number]  LOCATION:  [facility]                         FACILITY:  [facility]  PHYSICIAN:  Alexis Frock, MD     DATE OF BIRTH:  [birth year]  DATE OF PROCEDURE: 10/13/2014                                OPERATIVE REPORT   DIAGNOSES:  Left renal stone and functionally solitary kidney.  PROCEDURES: 1. Cystoscopy with left retrograde pyelogram with interpretation. 2. Left ureteroscopy with laser lithotripsy. 3. Exchange of left ureteral stent, 6 x 24, Contour, no tether.  ESTIMATED BLOOD LOSS:  Nil.  COMPLICATIONS:  None.  SPECIMEN:  Left renal stone fragments for compositional analysis.  FINDINGS: 1. Large volume left lower pole stone. 2. Complete resolution of gallstone fragments larger than 1/3rd mm     following laser lithotripsy and basket extraction. 3. Successful replacement of left ureteral stent, proximal and upper     pole distal in urinary bladder.  INDICATION:  [name] is a pleasant 59 year old lady with history of right renal atrophy and left renal compensatory hypertrophy making her functionally solitary kidney on the left.  She was noted to have a large left 10-mm UPJ stone and acute renal failure, for which, she underwent interval stenting.  Renal function has improved and she now presents for definitive stone management today, most recent urine without infectious parameters.  Informed consent was obtained and placed in the medical record.  PROCEDURE IN DETAIL:  The patient being Madison Gill, was verified. Procedure being left ureteroscopic stone manipulation was confirmed. Procedure was carried out.  Time-out was performed.  Intravenous antibiotics were administered.  General LMA anesthesia was introduced. The patient was placed into a low lithotomy position and sterile field was created by prepping and draping the patient's vagina, introitus, and proximal thighs using iodine x3.   Next, cystourethroscopy was performed using a 23-French rigid cystoscope with 30-degree offset lens. Inspection of the urinary bladder revealed distal end of the stent in situ.  This was grasped, brought to the level of the urethral meatus, though which, a 0.038 zip wire was advanced at the level of the upper pole and set aside as a safety wire.  Left ureteral orifice was then cannulated with a 6-French end-hole catheter and left retrograde pyelogram was obtained.  Left retrograde pyelogram demonstrated a single left ureter with single- system left kidney.  There was an ovoid radiodensity in the lower pole and filling defect in the same location consistent with known stone.  An 8-French feeding tube was placed in the urinary bladder for pressure release.  Next, semi-rigid ureteroscopy was performed in entire length of left ureter alongside a separate Sensor working wire.  No mucosal abnormalities were found.  The semi-rigid ureteroscope was exchanged for 24-cm 12/14 ureteral access sheath at the level of the proximal ureter using fluoroscopic vision.  Next, a flexible digital ureteroscopy was performed using a dual-channel ureteroscope.  Inspection of all calices revealed some upper pole papillary tip calcifications and a dominant lower pole stone likely representing prior UPJ stone that had been displaced.  This was grasped with an Escape basket and moved into an upper and mid  pole calyx for laser lithotripsy.  Holmium laser energy was applied to the stone using settings of 0.4 joules and 20 Hz fragmenting the stone into pieces, approximately 1-2 mm in diameter. The stone was quite dense.  Next, sequential basketing was performed using Escape basket, removing all of these fragments individually and setting aside for compositional analysis.  Additional energy was applied to the papillary tip calcifications of the upper pole completely ablating it.  Following these maneuvers, there was  excellent hemostasis. No evidence of perforation.  All stone fragments larger than 1/3rd mm had been removed.  Again, the patient's functionally solitary kidney was felt that the interval stenting would be warranted.  As such, the ureteroscope was removed under continuous ureteroscopic vision, no mucosal abnormalities were found.  Finally, a new 6 x 24, Contour-type stent was placed over the remaining safety wire using fluoroscopic guidance.  Good proximal and distal deployment were noted.  Bladder was emptied per cystoscope.  Procedure was then terminated.  The patient tolerated the procedure well.  There were no immediate periprocedural complications.  The patient was taken to the postanesthesia care unit in stable condition.          ______________________________ Alexis Frock, MD     TM/MEDQ  D:  10/13/2014  T:  10/13/2014  Job:  [id number]

## 2014-10-13 NOTE — Anesthesia Preprocedure Evaluation (Addendum)
Anesthesia Evaluation  Patient identified by MRN, date of birth, ID band Patient awake    Reviewed: Allergy & Precautions, NPO status , Patient's Chart, lab work & pertinent test results  Airway Mallampati: II  TM Distance: >3 FB Neck ROM: Full    Dental no notable dental hx.    Pulmonary neg pulmonary ROS,  breath sounds clear to auscultation  Pulmonary exam normal       Cardiovascular hypertension, Pt. on medications Normal cardiovascular examRhythm:Regular Rate:Normal     Neuro/Psych negative neurological ROS  negative psych ROS   GI/Hepatic negative GI ROS, Neg liver ROS,   Endo/Other  diabetes, Type 2, Oral Hypoglycemic Agents  Renal/GU Renal InsufficiencyRenal disease  negative genitourinary   Musculoskeletal negative musculoskeletal ROS (+)   Abdominal   Peds negative pediatric ROS (+)  Hematology negative hematology ROS (+)   Anesthesia Other Findings   Reproductive/Obstetrics negative OB ROS                            Anesthesia Physical Anesthesia Plan  ASA: II  Anesthesia Plan: General   Post-op Pain Management:    Induction: Intravenous  Airway Management Planned: LMA  Additional Equipment:   Intra-op Plan:   Post-operative Plan:   Informed Consent: I have reviewed the patients History and Physical, chart, labs and discussed the procedure including the risks, benefits and alternatives for the proposed anesthesia with the patient or authorized representative who has indicated his/her understanding and acceptance.   Dental advisory given  Plan Discussed with: CRNA  Anesthesia Plan Comments:         Anesthesia Quick Evaluation

## 2014-10-13 NOTE — Anesthesia Procedure Notes (Signed)
Procedure Name: LMA Insertion Date/Time: 10/13/2014 7:24 AM Performed by: Dimas Millin, Tramane Gorum F Pre-anesthesia Checklist: Patient identified, Emergency Drugs available, Suction available, Patient being monitored and Timeout performed Patient Re-evaluated:Patient Re-evaluated prior to inductionOxygen Delivery Method: Circle system utilized Preoxygenation: Pre-oxygenation with 100% oxygen Intubation Type: IV induction Ventilation: Mask ventilation without difficulty LMA: LMA inserted LMA Size: 4.0 Number of attempts: 1 Tube secured with: Tape Dental Injury: Teeth and Oropharynx as per pre-operative assessment

## 2014-10-13 NOTE — Transfer of Care (Signed)
Immediate Anesthesia Transfer of Care Note  Patient: Madison Gill  Procedure(s) Performed: Procedure(s): CYSTOSCOPY WITH LEFT RETROGRADE PYELOGRAM, URETEROSCOPY AND left STENt change  (Left) HOLMIUM LASER APPLICATION (Left)  Patient Location: PACU  Anesthesia Type:General  Level of Consciousness: awake, alert  and oriented  Airway & Oxygen Therapy: Patient Spontanous Breathing and Patient connected to face mask oxygen  Post-op Assessment: Report given to RN and Post -op Vital signs reviewed and stable  Post vital signs: Reviewed and stable  Last Vitals:  Filed Vitals:   10/13/14 0546  BP: 120/80  Pulse: 91  Temp: 36.6 C  Resp: 18    Complications: No apparent anesthesia complications

## 2014-10-13 NOTE — Discharge Instructions (Signed)
1 - You may have urinary urgency (bladder spasms) and bloody urine on / off with stent in place. This is normal. ° °2 - Call MD or go to ER for fever >102, severe pain / nausea / vomiting not relieved by medications, or acute change in medical status ° ° °Post Anesthesia Home Care Instructions ° °Activity: °Get plenty of rest for the remainder of the day. A responsible adult should stay with you for 24 hours following the procedure.  °For the next 24 hours, DO NOT: °-Drive a car °-Operate machinery °-Drink alcoholic beverages °-Take any medication unless instructed by your physician °-Make any legal decisions or sign important papers. ° °Meals: °Start with liquid foods such as gelatin or soup. Progress to regular foods as tolerated. Avoid greasy, spicy, heavy foods. If nausea and/or vomiting occur, drink only clear liquids until the nausea and/or vomiting subsides. Call your physician if vomiting continues. ° °Special Instructions/Symptoms: °Your throat may feel dry or sore from the anesthesia or the breathing tube placed in your throat during surgery. If this causes discomfort, gargle with warm salt water. The discomfort should disappear within 24 hours. ° °If you had a scopolamine patch placed behind your ear for the management of post- operative nausea and/or vomiting: ° °1. The medication in the patch is effective for 72 hours, after which it should be removed.  Wrap patch in a tissue and discard in the trash. Wash hands thoroughly with soap and water. °2. You may remove the patch earlier than 72 hours if you experience unpleasant side effects which may include dry mouth, dizziness or visual disturbances. °3. Avoid touching the patch. Wash your hands with soap and water after contact with the patch. °  ° ° ° ° °

## 2014-10-13 NOTE — H&P (Signed)
Madison Gill is an 59 y.o. female.    Chief Complaint: Pre-OP Left Ureteroscopic Stone Manipulation  HPI:    1 - Recurrent Nephrolithiasis - long h/o kidney stones previously treated with stent / SWL, medical therapy 08/2014 - Left 74mm UPJ stone, scattered left lower pole stones, Rt small parenchymal calcifications by CT --> left JJ stent 6x24 09/12/14  2 - Right Atrophic Kidney - nearly completely atrophic right kidney by imaging x several with left sided compensatory hypertrophy. Cr normal 2016 (0.9).  PMH sig for shoulder surgery, DM2. Her PCP is Madison Craver MD.    Today Madison Gill is seen to proceed with left ureteroscopic stone manipulation for goal of stone free in her funcitonally dominant kidney. No interval fevers.   Past Medical History  Diagnosis Date  . Hypertension   . History of kidney stones   . Diabetes mellitus   . HA (headache)     hx of  . Chronic renal insufficiency     in the setting of a solitary functioning left kidney with polycystic kidney disease  and hypertension  . Hypertriglyceridemia     Past Surgical History  Procedure Laterality Date  . Cholecystectomy  2002    Dr. Alphonsa Overall  . Cardiac catheterization  02/04/2006    normal  . Other surgical history      childbirth x 2  . Shoulder surgery    . Cystoscopy/ureteroscopy/holmium laser/stent placement Left 09/12/2014    Procedure: CYSTOSCOPY/LEFT RETROGRADE PYELOGRAM/LEFT URETERAL STENT PLACEMENT;  Surgeon: Carolan Clines, MD;  Location: [facility];  Service: Urology;  Laterality: Left;    Family History  Problem Relation Age of Onset  . Diabetes Mother   . Hypertension Mother   . Diabetes Father   . Hypertension Father    Social History:  reports that she has never smoked. She has never used smokeless tobacco. She reports that she does not drink alcohol or use illicit drugs.  Allergies:  Allergies  Allergen Reactions  . Codeine Nausea And Vomiting  . Crestor [Rosuvastatin Calcium]    myalgias  . Lipitor [Atorvastatin Calcium]     myalgias  . Ramipril     cough  . Toprol Xl [Metoprolol Succinate]     fatigue    Medications Prior to Admission  Medication Sig Dispense Refill  . aspirin EC 81 MG tablet Take 81 mg by mouth every morning.     . Canagliflozin (INVOKANA) 100 MG TABS Take 100 mg by mouth daily.    . carvedilol (COREG) 25 MG tablet Take 25 mg by mouth 2 (two) times daily with a meal.     . Exenatide ER 2 MG PEN Inject 2 mg into the skin once a week.     . fenofibrate micronized (LOFIBRA) 200 MG capsule Take 200 mg by mouth daily before breakfast.    . ibuprofen (ADVIL,MOTRIN) 200 MG tablet Take 600 mg by mouth every 6 (six) hours as needed for headache or moderate pain.    Marland Kitchen losartan (COZAAR) 25 MG tablet Take 25 mg by mouth daily.    . metFORMIN (GLUCOPHAGE) 500 MG tablet Take 500 mg by mouth 2 (two) times daily with a meal.    . oxybutynin (DITROPAN) 5 MG tablet Take 2x/day as needed for spasm. (Patient taking differently: Take 5 mg by mouth 2 (two) times daily as needed for bladder spasms. ) 30 tablet 3  . phenazopyridine (PYRIDIUM) 200 MG tablet Take 1 tablet (200 mg total) by mouth 3 (three) times  daily as needed for pain. 30 tablet 3  . traMADol-acetaminophen (ULTRACET) 37.5-325 MG per tablet Take 1 tablet by mouth every 6 (six) hours as needed for moderate pain. 30 tablet 2  . trimethoprim (TRIMPEX) 100 MG tablet Take 1 tablet (100 mg total) by mouth 1 day or 1 dose. (Patient taking differently: Take 100 mg by mouth every morning. ) 30 tablet 1  . azithromycin (ZITHROMAX Z-PAK) 250 MG tablet Take 1 tablet (250 mg total) by mouth daily. (Patient not taking: Reported on 07/21/2014) 4 tablet 0    Results for orders placed or performed during the hospital encounter of 10/12/14 (from the past 48 hour(s))  CBC     Status: None   Collection Time: 10/12/14  8:30 AM  Result Value Ref Range   WBC 6.3 4.0 - 10.5 K/uL   RBC 4.69 3.87 - 5.11 MIL/uL   Hemoglobin  14.2 12.0 - 15.0 g/dL   HCT 43.4 36.0 - 46.0 %   MCV 92.5 78.0 - 100.0 fL   MCH 30.3 26.0 - 34.0 pg   MCHC 32.7 30.0 - 36.0 g/dL   RDW 13.4 11.5 - 15.5 %   Platelets 283 150 - 400 K/uL  Basic metabolic panel     Status: Abnormal   Collection Time: 10/12/14  8:30 AM  Result Value Ref Range   Sodium 140 135 - 145 mmol/L   Potassium 4.3 3.5 - 5.1 mmol/L   Chloride 103 101 - 111 mmol/L   CO2 27 22 - 32 mmol/L   Glucose, Bld 182 (H) 65 - 99 mg/dL   BUN 27 (H) 6 - 20 mg/dL   Creatinine, Ser 1.02 (H) 0.44 - 1.00 mg/dL   Calcium 10.2 8.9 - 10.3 mg/dL   GFR calc non Af Amer 59 (L) >60 mL/min   GFR calc Af Amer >60 >60 mL/min    Comment: (NOTE) The eGFR has been calculated using the CKD EPI equation. This calculation has not been validated in all clinical situations. eGFR's persistently <60 mL/min signify possible Chronic Kidney Disease.    Anion gap 10 5 - 15   No results found.  Review of Systems  Constitutional: Negative.  Negative for fever.  HENT: Negative.   Eyes: Negative.   Respiratory: Negative.   Cardiovascular: Negative.   Gastrointestinal: Negative.   Genitourinary: Positive for urgency and frequency.       Consistent with mild stent colic  Musculoskeletal: Negative.   Skin: Negative.   Neurological: Negative.   Endo/Heme/Allergies: Negative.   Psychiatric/Behavioral: Negative.     Blood pressure 120/80, pulse 91, temperature 97.9 F (36.6 C), temperature source Oral, resp. rate 18, SpO2 98 %. Physical Exam  Constitutional: She appears well-developed.  HENT:  Head: Normocephalic.  Eyes: Pupils are equal, round, and reactive to light.  Neck: Normal range of motion.  Cardiovascular: Normal rate.   Respiratory: Effort normal.  GI: Soft.  Genitourinary:  No CVAT  Musculoskeletal: Normal range of motion.  Neurological: She is alert.  Skin: Skin is warm.  Psychiatric: She has a normal mood and affect. Her behavior is normal. Judgment and thought content  normal.     Assessment/Plan   1 - Recurrent Nephrolithiasis -  We rediscussed ureteroscopic stone manipulation with basketing and laser-lithotripsy in detail.  We rediscussed risks including bleeding, infection, damage to kidney / ureter  bladder, rarely loss of kidney. We rediscussed anesthetic risks and rare but serious surgical complications including DVT, PE, MI, and mortality. We specifically readdressed that  in 5-10% of cases a staged approach is required with stenting followed by re-attempt ureteroscopy if anatomy unfavorable.   The patient voiced understanding and wises to proceed today as planned. Likley replace left stent post-op given funcionally dominant.     2 - Right Atrophic Kidney - appears likely atrophic very early on as significant left sided compensatory hypertrophy  Linwood Gullikson 10/13/2014, 5:49 AM

## 2014-10-13 NOTE — Anesthesia Postprocedure Evaluation (Signed)
  Anesthesia Post-op Note  Patient: Madison Gill  Procedure(s) Performed: Procedure(s) (LRB): CYSTOSCOPY WITH LEFT RETROGRADE PYELOGRAM, URETEROSCOPY AND left STENt change  (Left) HOLMIUM LASER APPLICATION (Left)  Patient Location: PACU  Anesthesia Type: General  Level of Consciousness: awake and alert   Airway and Oxygen Therapy: Patient Spontanous Breathing  Post-op Pain: mild  Post-op Assessment: Post-op Vital signs reviewed, Patient's Cardiovascular Status Stable, Respiratory Function Stable, Patent Airway and No signs of Nausea or vomiting  Last Vitals:  Filed Vitals:   10/13/14 0952  BP: 120/76  Pulse: 80  Temp: 36.3 C  Resp: 16    Post-op Vital Signs: stable   Complications: No apparent anesthesia complications

## 2014-10-13 NOTE — Brief Op Note (Signed)
10/13/2014  8:57 AM  PATIENT:  Madison Gill  59 y.o. female  PRE-OPERATIVE DIAGNOSIS:  LEFT RENAL STONES, SOLITARY KIDNEY  POST-OPERATIVE DIAGNOSIS:  left renal stones solitary kidney  PROCEDURE:  Procedure(s): CYSTOSCOPY WITH LEFT RETROGRADE PYELOGRAM, URETEROSCOPY AND left STENt change  (Left) HOLMIUM LASER APPLICATION (Left)  SURGEON:  Surgeon(s) and Role:    * Alexis Frock, MD - Primary  PHYSICIAN ASSISTANT:   ASSISTANTS: none   ANESTHESIA:   general  EBL:  Total I/O In: 1000 [I.V.:1000] Out: -   BLOOD ADMINISTERED:none  DRAINS: none   LOCAL MEDICATIONS USED:  NONE  SPECIMEN:  Source of Specimen:  Left renal stone fragments for compositional analysis  DISPOSITION OF SPECIMEN:  [facility] for compositional analysis  COUNTS:  YES  TOURNIQUET:  * No tourniquets in log *  DICTATION: .Other Dictation: Dictation Number [id number]  PLAN OF CARE: Discharge to home after PACU  PATIENT DISPOSITION:  PACU - hemodynamically stable.   Delay start of Pharmacological VTE agent (>24hrs) due to surgical blood loss or risk of bleeding: yes

## 2014-10-16 ENCOUNTER — Encounter (HOSPITAL_COMMUNITY): Payer: Self-pay | Admitting: Urology

## 2014-11-01 ENCOUNTER — Ambulatory Visit: Payer: 59 | Admitting: *Deleted

## 2014-11-06 ENCOUNTER — Telehealth: Payer: Self-pay

## 2014-11-15 ENCOUNTER — Ambulatory Visit: Payer: 59 | Admitting: *Deleted

## 2014-12-13 ENCOUNTER — Ambulatory Visit: Payer: 59 | Admitting: *Deleted

## 2015-01-09 ENCOUNTER — Other Ambulatory Visit: Payer: Self-pay | Admitting: *Deleted

## 2015-01-10 NOTE — Patient Outreach (Signed)
[facility]) Care Management   01/09/15  FLORINDA TAFLINGER November 05, 1955 [id number]  ELKA SATTERFIELD is an 59 y.o. female who presents to the [facility] CM office for routine Link To Wellness follow up for self management assistance with Type II DM, HTN and hyperlipidemia.  Subjective:  Franklin says she has a rough time in May and June dealing with kidney stones and the surgeries to remove the stones, place and remove the stents. She says her urologist put her on a diuretic that will help to prevent further kidney stone formation but she says she has been excessively tired since starting the new medication and noticing some memory issues, like recalling words and forgetting things; she is wondering if the symptoms may be due to a low BP.  She says she completed both the [employer] study and the [employer] and her A1C was checked after the Time2Focus study was completed in June and her A1C was 6.0%. She says the care of her elderly Mom  continues to be an ongoing stress but she will get some respite when her Mom goes to stay with her brother in [place] next month. She also hired some part time private pay caregivers and that has reduced some of the stress and responsibilities. Says she just recently returned from [place],  [place] and had a great vacation with her husband Max and other family members.  Objective:   Review of Systems  Constitutional: Negative.     Physical Exam  Constitutional: She is oriented to person, place, and time. She appears well-developed and well-nourished.  Neurological: She is alert and oriented to person, place, and time.  Skin: Skin is warm and dry.  Psychiatric: She has a normal mood and affect. Her behavior is normal. Judgment and thought content normal.  She is wearing a Fit Bit to record her steps.  Filed Weights   01/09/15 1314  Weight: 153 lb 3.2 oz (69.491 kg)   Filed Vitals:   01/09/15 1314  BP: 100/78    Current  Medications:   Current Outpatient Prescriptions  Medication Sig Dispense Refill  . aspirin EC 81 MG tablet Take 81 mg by mouth every morning.     . Canagliflozin (INVOKANA) 100 MG TABS Take 100 mg by mouth daily.    . carvedilol (COREG) 25 MG tablet Take 12.5 mg by mouth 2 (two) times daily with a meal.     . Exenatide ER 2 MG PEN Inject 2 mg into the skin once a week.     . fenofibrate micronized (LOFIBRA) 200 MG capsule Take 200 mg by mouth daily before breakfast.    . ibuprofen (ADVIL,MOTRIN) 200 MG tablet Take 600 mg by mouth every 6 (six) hours as needed for headache or moderate pain.    . indapamide (LOZOL) 2.5 MG tablet Take 2.5 mg by mouth daily.    [place] losartan (COZAAR) 25 MG tablet Take 25 mg by mouth daily.    . metFORMIN (GLUCOPHAGE) 500 MG tablet Take 500 mg by mouth 2 (two) times daily with a meal.     No current facility-administered medications for this visit.    Functional Status:   In your present state of health, do you have any difficulty performing the following activities: 01/09/2015 10/12/2014  Hearing? N N  Vision? N N  Difficulty concentrating or making decisions? N N  Walking or climbing stairs? N N  Dressing or bathing? N N  Doing errands, shopping? N N  Fall/Depression Screening:    PHQ 2/9 Scores 01/09/2015 07/21/2014  PHQ - 2 Score 1 0    Assessment:   [employer] employee and Link To Wellness member with Type II DM, HTN and hyperlipidemia currently meeting treatment targets for all chronic health issues.  Plan:  [facility] CM Care Plan Problem One        Most Recent Value   Care Plan Problem One  Well controlled Type 2 DM with most recent A1C= 6.0% in June 2016   Role Documenting the Problem One  Care Management [place] for Problem One  Active   THN Long Term Goal (31-90 days)  Ongoing good control of Type II as evidenced by A1C <6.5% at next check   [facility] Long Term Goal Start Date  01/09/15   [facility] Long Term Goal Met Date  10/19/14    Interventions for Problem One Long Term Goal  reviewed Type 2 diabetes pathophysiology, reviewed CBG results, provided [place] with a second True Metrix glucometer at no cost so she can keep one at work and one at home, [employer] on completing her participation in the [employer] care initiative and [employer]2Focus study, discussed current DM medications and suggested she speak with Dr. Tollie Pizza about stopping Anastasio Auerbach since her urologist ordered a diuretic to prevent kidneys stones and she is experiencing some low BPs at times along with fatigue, discussed ongoing life  stressors and encouraged Nyjah to reward herself with an activity she enjoys to assist with stress reduction, will arrange for Link To Wellness follow up after she sees Dr. Tollie Pizza later this month      RNCM to fax today's office visit note to Dr. Tollie Pizza. RNCM will meet quarterly and as needed with patient per Link To Wellness program guidelines to assist with Type II DM self-management and assess patient's progress toward mutually set goals. Barrington Ellison RN,CCM,CDE [place] Management Coordinator Link To Wellness Office Phone [phone] Office Fax [phone]

## 2015-01-17 ENCOUNTER — Ambulatory Visit: Payer: 59 | Admitting: *Deleted

## 2015-01-19 ENCOUNTER — Encounter: Payer: Self-pay | Admitting: *Deleted

## 2015-01-19 NOTE — Patient Outreach (Signed)
Received e-mail from [name] with her recent lab results from her office visit with Dr. Tollie Pizza on 01/17/15. Her A1C= 6.2%. Lipid profile was normal except triglycerides were 179. Sent return e-mail to [name] providing positive feedback for her excellent glycemic control. Also faxed a letter of thanks to Dr. Tollie Pizza for the excellent care he provides to [name]. Barrington Ellison RN,CCM,CDE [place] Management Coordinator Link To Wellness Office Phone [phone] Office Fax [phone]

## 2015-04-27 MED FILL — LOSARTAN POTASSIUM 50 MG TA: 50 | 30 days supply | Qty: 30 | Fill #3

## 2015-05-02 MED FILL — BYDUREON 2 MG PEN INJECT: 2 | 28 days supply | Qty: 4 | Fill #3

## 2015-05-16 MED FILL — TRUE METRIX GLUCOSE TEST ST: 90 days supply | Qty: 200 | Fill #1

## 2015-05-17 MED FILL — INVOKANA 100 MG TABLET: 100 | 30 days supply | Qty: 30 | Fill #2

## 2015-05-25 MED FILL — BYDUREON 2 MG PEN INJECT: 2 | 28 days supply | Qty: 4 | Fill #4

## 2015-05-25 MED FILL — LOSARTAN POTASSIUM 50 MG TA: 50 | 30 days supply | Qty: 30 | Fill #4

## 2015-06-05 DIAGNOSIS — Z1231 Encounter for screening mammogram for malignant neoplasm of breast: Secondary | ICD-10-CM | POA: Diagnosis not present

## 2015-06-13 DIAGNOSIS — E119 Type 2 diabetes mellitus without complications: Secondary | ICD-10-CM | POA: Diagnosis not present

## 2015-06-13 DIAGNOSIS — I10 Essential (primary) hypertension: Secondary | ICD-10-CM | POA: Diagnosis not present

## 2015-06-20 DIAGNOSIS — G500 Trigeminal neuralgia: Secondary | ICD-10-CM | POA: Insufficient documentation

## 2015-06-20 DIAGNOSIS — E782 Mixed hyperlipidemia: Secondary | ICD-10-CM | POA: Diagnosis not present

## 2015-06-20 DIAGNOSIS — I10 Essential (primary) hypertension: Secondary | ICD-10-CM | POA: Diagnosis not present

## 2015-06-20 DIAGNOSIS — Z79899 Other long term (current) drug therapy: Secondary | ICD-10-CM | POA: Insufficient documentation

## 2015-06-20 DIAGNOSIS — E119 Type 2 diabetes mellitus without complications: Secondary | ICD-10-CM | POA: Diagnosis not present

## 2015-06-20 DIAGNOSIS — M5481 Occipital neuralgia: Secondary | ICD-10-CM | POA: Insufficient documentation

## 2015-06-20 MED FILL — INVOKANA 100 MG TABLET: 100 | 90 days supply | Qty: 90 | Fill #0

## 2015-06-20 MED FILL — metFORMIN HCL 500 MG TABS: 500 | 90 days supply | Qty: 180 | Fill #0

## 2015-06-20 MED FILL — FENOFIBRATE 200 MG CAPSULE: 200 | 90 days supply | Qty: 90 | Fill #0

## 2015-06-20 MED FILL — BYDUREON 2 MG PEN INJECT: 2 | 84 days supply | Qty: 12 | Fill #0

## 2015-06-20 MED FILL — CARVEDILOL 25 MG TABLET: 25 | 90 days supply | Qty: 90 | Fill #0

## 2015-06-20 MED FILL — INDAPAMIDE 2.5 MG TABLET: 2.5 | 90 days supply | Qty: 90 | Fill #0

## 2015-06-20 MED FILL — LOSARTAN POTASSIUM 50 MG TA: 50 | 90 days supply | Qty: 90 | Fill #0

## 2015-06-21 ENCOUNTER — Other Ambulatory Visit: Payer: Self-pay | Admitting: *Deleted

## 2015-06-21 NOTE — Patient Outreach (Signed)
Received email from [place] with her lab results of CBC, CMET, Lipid panel and Hgb A1C done on 06/13/15. Updated appropriated labs in DM template. Hgb A1C= 6.6% Lipid profile shows elevated Trigyerides at 248, low HDL at 34. Will see [place] for link To Wellness follow up on 08/01/15. Barrington Ellison RN,CCM,CDE [place] Management Coordinator Link To Wellness Office Phone [phone] Office Fax [phone]

## 2015-06-29 DIAGNOSIS — Z0000 Encounter for general adult medical examination without abnormal findings: Secondary | ICD-10-CM | POA: Diagnosis not present

## 2015-06-29 DIAGNOSIS — N200 Calculus of kidney: Secondary | ICD-10-CM | POA: Diagnosis not present

## 2015-06-29 DIAGNOSIS — N261 Atrophy of kidney (terminal): Secondary | ICD-10-CM | POA: Diagnosis not present

## 2015-06-29 DIAGNOSIS — N281 Cyst of kidney, acquired: Secondary | ICD-10-CM | POA: Diagnosis not present

## 2015-07-13 ENCOUNTER — Other Ambulatory Visit: Payer: Self-pay | Admitting: *Deleted

## 2015-07-13 NOTE — Patient Outreach (Signed)
Received word that Karan's Mom died last evening. Condolence message left on her cell phone. Barrington Ellison RN,CCM,CDE [place] Management Coordinator Link To Wellness Office Phone [phone] Office Fax [phone]

## 2015-08-01 ENCOUNTER — Other Ambulatory Visit: Payer: Self-pay | Admitting: *Deleted

## 2015-08-06 ENCOUNTER — Encounter: Payer: Self-pay | Admitting: *Deleted

## 2015-08-06 NOTE — Patient Outreach (Signed)
[facility]) Care Management   08/01/15  Madison Gill 1956/04/12 [id number]  Madison Gill is an 60 y.o. female who presents to the [place] Management office for routine Link To Wellness follow up for self management assistance with Type II DM, HTN and hyperlipidemia.  Subjective: Madison Gill says she is still actively grieving the death of her Mom on 2015-08-09. She says she and her brother were present and her Mom's death was peaceful. She says she takes comfort in a book that was a favorite of her Mom's,  One Minute after You Die and looking at photographs and remembering fond memories. She remains an active participant in the [facility] trial and says her blood sugar is in good control but she has difficulty meeting her daily activity goals.  She says she saw Dr. Tollie Gill in March and no changes were made to her treatment regimen.  Objective:   Review of Systems  Constitutional: Negative.     Physical Exam  Constitutional: She is oriented to person, place, and time. She appears well-developed and well-nourished.  Respiratory: Effort normal.  Neurological: She is alert and oriented to person, place, and time.  Skin: Skin is warm and dry.  Psychiatric: She has a normal mood and affect. Her behavior is normal. Judgment and thought content normal.   Filed Weights   08/01/15 1400  Weight: 153 lb 3.2 oz (69.491 kg)   Filed Vitals:   08/01/15 1400  BP: 100/60    Encounter Medications: Outpatient Encounter Prescriptions as of 08/01/2015  Medication Sig Note  . aspirin EC 81 MG tablet Take 81 mg by mouth every morning.    . Canagliflozin (INVOKANA) 100 MG TABS Take 100 mg by mouth daily.   . carvedilol (COREG) 25 MG tablet Take 12.5 mg by mouth 2 (two) times daily with a meal.    . Exenatide ER 2 MG PEN Inject 2 mg into the skin once a week.  07/21/2014: States she takes every Tuesday  . fenofibrate micronized (LOFIBRA) 200 MG capsule Take  200 mg by mouth daily before breakfast.   . ibuprofen (ADVIL,MOTRIN) 200 MG tablet Take 600 mg by mouth every 6 (six) hours as needed for headache or moderate pain.   . indapamide (LOZOL) 2.5 MG tablet Take 2.5 mg by mouth daily. 01/10/2015: To prevent calcium kidney stone formation  . losartan (COZAAR) 25 MG tablet Take 25 mg by mouth daily. 08/01/2015: Takes 1/2 tablet (12.5 mg) daily  . metFORMIN (GLUCOPHAGE) 500 MG tablet Take 500 mg by mouth 2 (two) times daily with a meal.       Functional Status:   In your present state of health, do you have any difficulty performing the following activities: 08/01/2015 01/09/2015  Hearing? N N  Vision? N N  Difficulty concentrating or making decisions? N N  Walking or climbing stairs? N N  Dressing or bathing? N N  Doing errands, shopping? N N    Fall/Depression Screening:    PHQ 2/9 Scores 01/09/2015 07/21/2014  PHQ - 2 Score 1 0    Assessment:   Madison Gill employee and Link To Wellness member with Type II DM, HTN and hyperlipidemia. Meeting treatment targets for HTN and DM (Hgb A1C <7.0%) but lipid profile on 06/13/15 shows elevated triglycerides at 248 .  Plan:  [facility] CM Care Plan Problem One        Most Recent Value   Care Plan Problem One  Type 2 DM,  hyperlipidemia and HTN meeting treatment  targets for DM and HTN but not with lipid control as evidenced by labs drawn on 06/13/15   Role Documenting the Problem One  Care Management [place] for Problem One  Active   THN Long Term Goal (31-90 days)  Good control of Type II DM, HTN and hyperlipidemia as evidenced by normal labs and readings at next assessment with Hgb A1C <7.0% and 90% of self monitored CBG meeting target   THN Long Term Goal Start Date  08/01/15   Interventions for Problem One Long Term Goal  reviewed Type 2 diabetes pathophysiology, reviewed CBG results,activity, weight and medication adherence in the [employer], congratulated Madison Gill for ongoing participation in  the [employer] care initiative with a current estimated Hgb A1C = 5.91% and 90% blood sugars meeting target per the app, reviewed medications and assessed adherence, provided time for Madison Gill to discuss and activity grieve the death of her Mom on 2015-07-14 and provided emotional support, will arrange for Link To Wellness follow up after she completes the [facility] trial at the end of June       RNCM to fax today's office visit note to Dr. Tollie Gill. RNCM will meet quarterly and as needed with patient per Link To Wellness program guidelines to assist with Type II DM, HTN and hyperlipidemia self-management and assess patient's progress toward mutually set goals. Barrington Ellison RN,CCM,CDE [place] Management Coordinator Link To Wellness Office Phone [phone] Office Fax [phone]

## 2015-09-17 MED FILL — LOSARTAN POTASSIUM 50 MG TA: 50 | 90 days supply | Qty: 90 | Fill #1

## 2015-09-17 MED FILL — INDAPAMIDE 2.5 MG TABLET: 2.5 | 90 days supply | Qty: 90 | Fill #1

## 2015-09-17 MED FILL — ONDANSETRON HCL 8 MG TABLET: 8 | 5 days supply | Qty: 20 | Fill #3

## 2015-09-17 MED FILL — FENOFIBRATE 200 MG CAPSULE: 200 | 90 days supply | Qty: 90 | Fill #1

## 2015-09-17 MED FILL — CARVEDILOL 25 MG TABLET: 25 | 90 days supply | Qty: 90 | Fill #1

## 2015-09-17 MED FILL — metFORMIN HCL 500 MG TABS: 500 | 90 days supply | Qty: 180 | Fill #1

## 2015-09-17 MED FILL — TRUE METRIX GLUCOSE TEST ST: 90 days supply | Qty: 200 | Fill #2

## 2015-09-17 MED FILL — BYDUREON 2 MG PEN INJECT: 2 | 28 days supply | Qty: 4 | Fill #5

## 2015-09-18 MED FILL — INVOKANA 100 MG TABLET: 100 | 90 days supply | Qty: 90 | Fill #1

## 2015-09-25 DIAGNOSIS — H5213 Myopia, bilateral: Secondary | ICD-10-CM | POA: Diagnosis not present

## 2015-09-25 DIAGNOSIS — H524 Presbyopia: Secondary | ICD-10-CM | POA: Diagnosis not present

## 2015-09-25 DIAGNOSIS — E119 Type 2 diabetes mellitus without complications: Secondary | ICD-10-CM | POA: Diagnosis not present

## 2015-09-25 DIAGNOSIS — Z7984 Long term (current) use of oral hypoglycemic drugs: Secondary | ICD-10-CM | POA: Diagnosis not present

## 2015-09-25 DIAGNOSIS — H52223 Regular astigmatism, bilateral: Secondary | ICD-10-CM | POA: Diagnosis not present

## 2015-10-17 ENCOUNTER — Other Ambulatory Visit: Payer: Self-pay | Admitting: Obstetrics and Gynecology

## 2015-10-17 DIAGNOSIS — Z124 Encounter for screening for malignant neoplasm of cervix: Secondary | ICD-10-CM | POA: Diagnosis not present

## 2015-10-17 DIAGNOSIS — Z6826 Body mass index (BMI) 26.0-26.9, adult: Secondary | ICD-10-CM | POA: Diagnosis not present

## 2015-10-17 DIAGNOSIS — Z01419 Encounter for gynecological examination (general) (routine) without abnormal findings: Secondary | ICD-10-CM | POA: Diagnosis not present

## 2015-10-18 LAB — CYTOLOGY - PAP

## 2015-10-24 MED FILL — BYDUREON 2 MG PEN INJECT: 2 | 56 days supply | Qty: 8 | Fill #1

## 2015-11-28 ENCOUNTER — Other Ambulatory Visit: Payer: Self-pay | Admitting: *Deleted

## 2015-11-28 NOTE — Patient Outreach (Signed)
Text to [place] and also left message on her mobile phone that her 1:30 pm appointment  today will need to be rescheduled due to power, phone and Internet at the [place] office . Advised her she will receive an e-mail regarding follow up. Barrington Ellison RN,CCM,CDE [place] Management Coordinator Link To Wellness Office Phone [phone] Office Fax [phone]

## 2015-12-05 ENCOUNTER — Other Ambulatory Visit: Payer: Self-pay | Admitting: *Deleted

## 2015-12-06 ENCOUNTER — Encounter: Payer: Self-pay | Admitting: *Deleted

## 2015-12-06 NOTE — Patient Outreach (Addendum)
[place] Madison Gill) Care Gill   12/05/2015  Madison Gill [birth year] [id number]  Madison Gill is an 60 y.o. female who presents to the Madison Gill office for routine Link To Wellness follow up for self Gill assistance with Type II DM, HTN, hyperlipidemia and overweight.  Subjective: Madison Gill says she is still heavily grieving from the death of her Mom in July 11, 2022 of this year. She says she recently starting having panic attacks. She is also upset that Dr. Tollie Pizza will be retiring on 12/21/15 as he cared for her Mom and Madison Gill for many years and she thinks very highly of him. She says she will see him for the last time on 12/18/15.  Objective:   Review of Systems  Constitutional: Negative.     Physical Exam  Constitutional: She is oriented to person, place, and time. She appears well-developed and well-nourished.  Respiratory: Effort normal.  Neurological: She is alert and oriented to person, place, and time.  Skin: Skin is warm and dry.  Psychiatric: She has a normal mood and affect. Her behavior is normal. Judgment and thought content normal.   Filed Weights   12/05/15 1315  Weight: 159 lb (72.1 kg)   Vitals:   12/05/15 1315  BP: 110/78    Encounter Medications:   Outpatient Encounter Prescriptions as of 12/05/2015  Medication Sig Note  . aspirin EC 81 MG tablet Take 81 mg by mouth every morning.    . Canagliflozin (INVOKANA) 100 MG TABS Take 100 mg by mouth daily.   . carvedilol (COREG) 25 MG tablet Take 12.5 mg by mouth 2 (two) times daily with a meal.    . cephALEXin (KEFLEX) 500 MG capsule Take 1 capsule (500 mg total) by mouth 2 (two) times daily. X 3 days. Begin day prior to next Urology appointment (Patient not taking: Reported on 01/09/2015)   . Exenatide ER 2 MG PEN Inject 2 mg into the skin once a week.  07/21/2014: States she takes every Tuesday  . fenofibrate micronized (LOFIBRA) 200 MG capsule Take 200 mg by  mouth daily before breakfast.   . ibuprofen (ADVIL,MOTRIN) 200 MG tablet Take 600 mg by mouth every 6 (six) hours as needed for headache or moderate pain.   . indapamide (LOZOL) 2.5 MG tablet Take 2.5 mg by mouth daily. 01/10/2015: To prevent calcium kidney stone formation  . losartan (COZAAR) 25 MG tablet Take 25 mg by mouth daily. 08/01/2015: Takes 1/2 tablet (12.5 mg) daily  . metFORMIN (GLUCOPHAGE) 500 MG tablet Take 500 mg by mouth 2 (two) times daily with a meal.   . oxybutynin (DITROPAN) 5 MG tablet Take 2x/day as needed for spasm. (Patient not taking: Reported on 01/10/2015)   . oxyCODONE-acetaminophen (ROXICET) 5-325 MG per tablet Take 1-2 tablets by mouth every 4 (four) hours as needed for moderate pain or severe pain. Post-operatively (Patient not taking: Reported on 01/10/2015)   . phenazopyridine (PYRIDIUM) 200 MG tablet Take 1 tablet (200 mg total) by mouth 3 (three) times daily as needed for pain. (Patient not taking: Reported on 01/10/2015)   . senna-docusate (SENOKOT-S) 8.6-50 MG per tablet Take 1 tablet by mouth 2 (two) times daily. While taking pain meds to prevent constipation (Patient not taking: Reported on 01/10/2015)    No facility-administered encounter medications on file as of 12/05/2015.     Functional Status:   In your present state of health, do you have any difficulty performing the following activities: 08/01/2015 01/09/2015  Hearing? N N  Vision? N N  Difficulty concentrating or making decisions? N N  Walking or climbing stairs? N N  Dressing or bathing? N N  Doing errands, shopping? N N  Some recent data might be hidden    Fall/Depression Screening:    PHQ 2/9 Scores 01/09/2015 07/21/2014  PHQ - 2 Score 1 0    Assessment:  [employer] employee and Link To Wellness member with Type II DM, HTN and hyperlipidemia. Meeting treatment targets for HTN and DM (Hgb A1C <7.0%) but lipid profile on 06/13/15 shows elevated triglycerides at 248   Plan:  [facility] CM Care Plan  Problem One   Flowsheet Row Most Recent Value  Care Plan Problem One Type 2 DM, hyperlipidemia and HTN meeting treatment  targets for DM and HTN but not with lipid control as evidenced by labs drawn on 06/13/15  Role Documenting the Problem One  Care [place] Coordinator  Care Plan for Problem One  Active  THN Long Term Goal (31-90 days)  Good control of Type II DM, HTN and hyperlipidemia as evidenced by normal labs and readings at next assessment with Hgb A1C <7.0% and 90% of self monitored CBG meeting target  THN Long Term Goal Start Date  12/05/15  [facility] Long Term Goal Met Date    Interventions for Problem One Long Term Goal Spent most of the appointment time allowing Alazae to grieve her Mom's death and provided emotional support,  suggested she look into finding a grief support group, discussed symptoms of a panic attack and loaned her a book entitled "Anxiety and Panic Attacks and How to Stop Them",discussed [employer] trial and her post trial interview,  reviewed medications and assessed adherence, will determine timing for Link To Wellness follow up after she sees her primary care giver on 12/18/15     RNCM to fax today's office visit note to Dr. Tollie Pizza. RNCM will meet quarterly and as needed with patient per Link To Wellness program guidelines to assist with Type II DM, HTN, hyperlipidemia and overweight self-Gill and assess patient's progress toward mutually set goals.  Barrington Ellison RN,CCM,CDE [place] Gill Coordinator Link To Wellness Office Phone [phone] Office Fax [phone]

## 2015-12-17 MED FILL — FENOFIBRATE 200 MG CAPSULE: 200 | 90 days supply | Qty: 90 | Fill #1

## 2015-12-17 MED FILL — metFORMIN HCL 500 MG TABS: 500 | 90 days supply | Qty: 180 | Fill #1

## 2015-12-17 MED FILL — LOSARTAN POTASSIUM 50 MG TA: 50 | 90 days supply | Qty: 90 | Fill #0

## 2015-12-17 MED FILL — INDAPAMIDE 2.5 MG TABLET: 2.5 | 90 days supply | Qty: 90 | Fill #2

## 2015-12-17 MED FILL — CARVEDILOL 25 MG TABLET: 25 | 90 days supply | Qty: 90 | Fill #0

## 2015-12-17 MED FILL — INVOKANA 100 MG TABLET: 100 | 90 days supply | Qty: 90 | Fill #0

## 2015-12-18 DIAGNOSIS — E119 Type 2 diabetes mellitus without complications: Secondary | ICD-10-CM | POA: Diagnosis not present

## 2015-12-18 DIAGNOSIS — I10 Essential (primary) hypertension: Secondary | ICD-10-CM | POA: Diagnosis not present

## 2015-12-18 DIAGNOSIS — E782 Mixed hyperlipidemia: Secondary | ICD-10-CM | POA: Diagnosis not present

## 2015-12-18 MED FILL — TRUE METRIX GLUCOSE TEST ST: 50 days supply | Qty: 100 | Fill #0

## 2015-12-18 MED FILL — BYDUREON 2 MG PEN INJECT: 2 | 84 days supply | Qty: 12 | Fill #0

## 2015-12-27 MED FILL — ALPRAZolam 0.5 MG TABS: 0.5 | 30 days supply | Qty: 90 | Fill #0

## 2016-01-03 ENCOUNTER — Encounter (INDEPENDENT_AMBULATORY_CARE_PROVIDER_SITE_OTHER): Payer: 59 | Admitting: Ophthalmology

## 2016-01-03 DIAGNOSIS — H33303 Unspecified retinal break, bilateral: Secondary | ICD-10-CM

## 2016-01-03 DIAGNOSIS — H43812 Vitreous degeneration, left eye: Secondary | ICD-10-CM | POA: Diagnosis not present

## 2016-01-03 DIAGNOSIS — H35033 Hypertensive retinopathy, bilateral: Secondary | ICD-10-CM | POA: Diagnosis not present

## 2016-01-03 DIAGNOSIS — H43813 Vitreous degeneration, bilateral: Secondary | ICD-10-CM

## 2016-01-03 DIAGNOSIS — I10 Essential (primary) hypertension: Secondary | ICD-10-CM

## 2016-01-03 DIAGNOSIS — H35413 Lattice degeneration of retina, bilateral: Secondary | ICD-10-CM

## 2016-01-04 DIAGNOSIS — M5441 Lumbago with sciatica, right side: Secondary | ICD-10-CM | POA: Diagnosis not present

## 2016-01-04 MED FILL — tiZANidine HCL 4 MG TABS: 4 | 10 days supply | Qty: 15 | Fill #0

## 2016-01-16 ENCOUNTER — Encounter (INDEPENDENT_AMBULATORY_CARE_PROVIDER_SITE_OTHER): Payer: 59 | Admitting: Ophthalmology

## 2016-01-16 DIAGNOSIS — H33301 Unspecified retinal break, right eye: Secondary | ICD-10-CM | POA: Diagnosis not present

## 2016-01-16 MED FILL — PREDNISOLONE AC 1% EYE DROP: 1 | 7 days supply | Qty: 5 | Fill #0

## 2016-01-23 ENCOUNTER — Ambulatory Visit (INDEPENDENT_AMBULATORY_CARE_PROVIDER_SITE_OTHER): Payer: 59 | Admitting: Ophthalmology

## 2016-01-23 DIAGNOSIS — H33303 Unspecified retinal break, bilateral: Secondary | ICD-10-CM

## 2016-02-14 ENCOUNTER — Ambulatory Visit: Payer: 59 | Admitting: *Deleted

## 2016-03-10 DIAGNOSIS — R399 Unspecified symptoms and signs involving the genitourinary system: Secondary | ICD-10-CM | POA: Diagnosis not present

## 2016-03-10 DIAGNOSIS — R110 Nausea: Secondary | ICD-10-CM | POA: Diagnosis not present

## 2016-03-10 MED FILL — INDAPAMIDE 2.5 MG TABLET: 2.5 | 90 days supply | Qty: 90 | Fill #0

## 2016-03-10 MED FILL — ONDANSETRON HCL 4 MG TABLET: 4 | 7 days supply | Qty: 20 | Fill #0

## 2016-03-10 MED FILL — BYDUREON 2 MG PEN INJECT: 2 | 84 days supply | Qty: 12 | Fill #1

## 2016-03-10 MED FILL — CIPROFLOXACIN HCL 500 MG TA: 500 | 10 days supply | Qty: 20 | Fill #0

## 2016-03-12 ENCOUNTER — Other Ambulatory Visit: Payer: Self-pay | Admitting: *Deleted

## 2016-03-12 VITALS — BP 116/70 | Wt 161.4 lb

## 2016-03-12 DIAGNOSIS — R1084 Generalized abdominal pain: Secondary | ICD-10-CM | POA: Diagnosis not present

## 2016-03-12 DIAGNOSIS — E119 Type 2 diabetes mellitus without complications: Secondary | ICD-10-CM

## 2016-03-12 DIAGNOSIS — R300 Dysuria: Secondary | ICD-10-CM | POA: Diagnosis not present

## 2016-03-12 LAB — POCT GLYCOSYLATED HEMOGLOBIN (HGB A1C): Hemoglobin A1C: 7.1

## 2016-03-12 MED FILL — FLUCONAZOLE 150 MG TABLET: 150 | 1 days supply | Qty: 1 | Fill #0

## 2016-03-17 MED FILL — LOSARTAN POTASSIUM 50 MG TA: 50 | 90 days supply | Qty: 90 | Fill #0

## 2016-03-17 MED FILL — CARVEDILOL 25 MG TABLET: 25 | 90 days supply | Qty: 90 | Fill #0

## 2016-03-17 MED FILL — FENOFIBRATE 200 MG CAPSULE: 200 | 90 days supply | Qty: 90 | Fill #0

## 2016-03-17 MED FILL — INVOKANA 100 MG TABLET: 100 | 90 days supply | Qty: 90 | Fill #0

## 2016-03-17 MED FILL — metFORMIN HCL 500 MG TABS: 500 | 90 days supply | Qty: 180 | Fill #0

## 2016-03-17 NOTE — Patient Outreach (Signed)
[facility]) Care Management   03/12/2016  Madison Gill Nov 11, 1955 [id number]  Madison Gill is an 60 y.o. female who presents to the [place] Management office for routine Link To Wellness follow up for self management assistance with Type II DM, HTN, hyperlipidemia and overweight.  Subjective: Madison Gill says she is currently experiencing symptoms of urinary frequency, flank pain and burning with urination and nausea that started a few days ago. She denies fevers, chills or hematuria. She says she was seen at her primary care provider office on 11/20 and was given a  prescription for Zofran and Cipro. She said despite the medications, the symptoms persists so she was seen in her urologist's office today and was told she does not have a urinary tracy infection but rather a vaginal yeast infection. She says she was given a prescription for a single dose of diflucan.  She says she has been taking some left over Pyridium and prescription pain medication at night to help with the pain so she can sleep.     She says she is still grieving the death of her Mom in 2022-07-18 of this year and her grief has been exacerbated by the upcoming Thanksgiving Holiday. She says she had another mild panic attack this morning that she was able to control through self talk. She voices appreciation for the panic attack book this RNCM loaned her.  She also says she had an incident with seeing flashes of light and floaters in her visual field and was referred to an ophthalmologist and required laser surgery.  She is requesting a POC Hgb A1C as the last time it was checked was on 12/18/15 and it was 6.9%. She says she expects it to be higher due to increased dietary indiscretions.   Objective:   Review of Systems  Constitutional: Negative.     Physical Exam  Constitutional: She is oriented to person, place, and time. She appears well-developed and well-nourished.  Respiratory:  Effort normal.  Neurological: She is alert and oriented to person, place, and time.  Skin: Skin is warm and dry.  Psychiatric: She has a normal mood and affect. Her behavior is normal. Judgment and thought content normal.   Filed Weights   03/12/16 1314  Weight: 161 lb 6.4 oz (73.2 kg)   Vitals:   03/12/16 1314  BP: 116/70   POC Hgb A1C= 7.1%  POD random CBG= 166  Encounter Medications:   Outpatient Encounter Prescriptions as of 03/12/2016  Medication Sig Note  . aspirin EC 81 MG tablet Take 81 mg by mouth every morning.    . Canagliflozin (INVOKANA) 100 MG TABS Take 100 mg by mouth daily.   . carvedilol (COREG) 25 MG tablet Take 12.5 mg by mouth 2 (two) times daily with a meal.    . Exenatide ER 2 MG PEN Inject 2 mg into the skin once a week.  07/21/2014: States she takes every Tuesday  . fenofibrate micronized (LOFIBRA) 200 MG capsule Take 200 mg by mouth daily before breakfast.   . ibuprofen (ADVIL,MOTRIN) 200 MG tablet Take 600 mg by mouth every 6 (six) hours as needed for headache or moderate pain.   . indapamide (LOZOL) 2.5 MG tablet Take 2.5 mg by mouth daily. 01/10/2015: To prevent calcium kidney stone formation  . losartan (COZAAR) 25 MG tablet Take 25 mg by mouth daily. 08/01/2015: Takes 1/2 tablet (12.5 mg) daily  . metFORMIN (GLUCOPHAGE) 500 MG tablet Take 500 mg  by mouth 2 (two) times daily with a meal.   . phenazopyridine (PYRIDIUM) 200 MG tablet Take 1 tablet (200 mg total) by mouth 3 (three) times daily as needed for pain.   . cephALEXin (KEFLEX) 500 MG capsule Take 1 capsule (500 mg total) by mouth 2 (two) times daily. X 3 days. Begin day prior to next Urology appointment (Patient not taking: Reported on 03/12/2016)   . oxybutynin (DITROPAN) 5 MG tablet Take 2x/day as needed for spasm. (Patient not taking: Reported on 03/12/2016)   . oxyCODONE-acetaminophen (ROXICET) 5-325 MG per tablet Take 1-2 tablets by mouth every 4 (four) hours as needed for moderate pain or severe  pain. Post-operatively (Patient not taking: Reported on 03/12/2016)   . senna-docusate (SENOKOT-S) 8.6-50 MG per tablet Take 1 tablet by mouth 2 (two) times daily. While taking pain meds to prevent constipation (Patient not taking: Reported on 03/12/2016)    No facility-administered encounter medications on file as of 03/12/2016.     Functional Status:   In your present state of health, do you have any difficulty performing the following activities: 08/01/2015  Hearing? N  Vision? N  Difficulty concentrating or making decisions? N  Walking or climbing stairs? N  Dressing or bathing? N  Doing errands, shopping? N  Some recent data might be hidden    Fall/Depression Screening:    PHQ 2/9 Scores 01/09/2015 07/21/2014  PHQ - 2 Score 1 0    Assessment:  [employer] employee and Link To Wellness member with Type II DM, HTN and hyperlipidemia  Plan:  [facility] CM Care Plan Problem One   Flowsheet Row Most Recent Value  Care Plan Problem One Type 2 DM, hyperlipidemia and HTN meeting treatment  targets for  HTN only, today's POC Hgb A1C= 7.1%, lipid panel of 06/13/15 showed elevated triglycerides at 248 and low HDL of 34  Role Documenting the Problem One  Care Management Coordinator  Care Plan for Problem One  Active  THN Long Term Goal (31-90 days) Improved control of Type II DM, HTN and hyperlipidemia as evidenced by normal labs and readings at next assessment with Hgb A1C <7.0% and 90% of self monitored CBG meeting target  THN Long Term Goal Start Date 03/12/16  THN Long Term Goal Met Date    Interventions for Problem One Long Term Goal Updated Madison Gill's health history since last visit to include laser eye surgery and current UTI symptoms,  Provided time for Madison Gill to discuss her ongoing grief related to her Mom's death and provided emotional support, reviewed medications and assessed adherence, assessed POC CBG and Hgb A1C  and discussed results, ensured that she is enrolled in [employer] and advised  her disease self management follow up will occur via [place] to fax today's office visit note to primary care provider's office.   Barrington Ellison RN,CCM,CDE [place] Management Coordinator Link To Wellness Office Phone [phone] Office Fax [phone]

## 2016-05-26 ENCOUNTER — Ambulatory Visit (INDEPENDENT_AMBULATORY_CARE_PROVIDER_SITE_OTHER): Payer: 59 | Admitting: Ophthalmology

## 2016-06-09 DIAGNOSIS — E781 Pure hyperglyceridemia: Secondary | ICD-10-CM | POA: Diagnosis not present

## 2016-06-09 DIAGNOSIS — N189 Chronic kidney disease, unspecified: Secondary | ICD-10-CM | POA: Diagnosis not present

## 2016-06-09 DIAGNOSIS — E119 Type 2 diabetes mellitus without complications: Secondary | ICD-10-CM | POA: Diagnosis not present

## 2016-06-09 DIAGNOSIS — I129 Hypertensive chronic kidney disease with stage 1 through stage 4 chronic kidney disease, or unspecified chronic kidney disease: Secondary | ICD-10-CM | POA: Diagnosis not present

## 2016-06-11 DIAGNOSIS — E119 Type 2 diabetes mellitus without complications: Secondary | ICD-10-CM | POA: Diagnosis not present

## 2016-06-11 DIAGNOSIS — F419 Anxiety disorder, unspecified: Secondary | ICD-10-CM | POA: Diagnosis not present

## 2016-06-11 DIAGNOSIS — I10 Essential (primary) hypertension: Secondary | ICD-10-CM | POA: Diagnosis not present

## 2016-06-11 DIAGNOSIS — E782 Mixed hyperlipidemia: Secondary | ICD-10-CM | POA: Diagnosis not present

## 2016-06-11 MED FILL — ACCU-CHEK GUIDE TEST STRIP: 90 days supply | Qty: 200 | Fill #0

## 2016-06-11 MED FILL — metFORMIN HCL 500 MG TABS: 500 | 90 days supply | Qty: 180 | Fill #0

## 2016-06-11 MED FILL — LOSARTAN POTASSIUM 50 MG TA: 50 | 90 days supply | Qty: 90 | Fill #0

## 2016-06-11 MED FILL — ALPRAZolam 0.5 MG TABS: 0.5 | 90 days supply | Qty: 90 | Fill #0

## 2016-06-11 MED FILL — FENOFIBRATE 200 MG CAPSULE: 200 | 90 days supply | Qty: 90 | Fill #0

## 2016-06-11 MED FILL — CARVEDILOL 25 MG TABLET: 25 | 90 days supply | Qty: 90 | Fill #0

## 2016-06-12 ENCOUNTER — Ambulatory Visit (INDEPENDENT_AMBULATORY_CARE_PROVIDER_SITE_OTHER): Payer: 59 | Admitting: Ophthalmology

## 2016-06-12 DIAGNOSIS — H2513 Age-related nuclear cataract, bilateral: Secondary | ICD-10-CM

## 2016-06-12 DIAGNOSIS — H33303 Unspecified retinal break, bilateral: Secondary | ICD-10-CM

## 2016-06-12 DIAGNOSIS — I10 Essential (primary) hypertension: Secondary | ICD-10-CM | POA: Diagnosis not present

## 2016-06-12 DIAGNOSIS — H43813 Vitreous degeneration, bilateral: Secondary | ICD-10-CM

## 2016-06-12 DIAGNOSIS — H35033 Hypertensive retinopathy, bilateral: Secondary | ICD-10-CM | POA: Diagnosis not present

## 2016-06-13 MED FILL — INDAPAMIDE 2.5 MG TAB: 2.5 | 90 days supply | Qty: 90 | Fill #1

## 2016-06-17 DIAGNOSIS — J029 Acute pharyngitis, unspecified: Secondary | ICD-10-CM | POA: Diagnosis not present

## 2016-06-17 MED FILL — AMOXICILLIN 500 MG CAPSULE: 500 | 10 days supply | Qty: 20 | Fill #0

## 2016-07-07 MED FILL — INVOKANA 300 MG TABLET: 300 | 90 days supply | Qty: 90 | Fill #0

## 2016-07-16 MED FILL — FLUCONAZOLE 150 MG TABLET: 150 | 3 days supply | Qty: 2 | Fill #0

## 2016-07-16 MED FILL — CLOTRIMAZOLE-BETAMETHASONE: 1-0.05 | 14 days supply | Qty: 45 | Fill #0

## 2016-07-21 ENCOUNTER — Encounter (HOSPITAL_COMMUNITY): Payer: Self-pay | Admitting: *Deleted

## 2016-07-21 ENCOUNTER — Ambulatory Visit (HOSPITAL_COMMUNITY): Payer: 59

## 2016-07-21 ENCOUNTER — Ambulatory Visit (HOSPITAL_COMMUNITY): Payer: 59 | Admitting: Certified Registered Nurse Anesthetist

## 2016-07-21 ENCOUNTER — Ambulatory Visit (HOSPITAL_COMMUNITY)
Admission: AD | Admit: 2016-07-21 | Discharge: 2016-07-21 | Disposition: A | Discharge status: Home / Self Care | Payer: 59 | Source: Ambulatory Visit | Attending: Urology | Admitting: Urology

## 2016-07-21 ENCOUNTER — Other Ambulatory Visit: Payer: Self-pay | Admitting: Urology

## 2016-07-21 ENCOUNTER — Encounter (HOSPITAL_COMMUNITY)
Admission: AD | Disposition: A | Discharge status: Home / Self Care | Payer: Self-pay | Source: Ambulatory Visit | Attending: Urology

## 2016-07-21 DIAGNOSIS — E119 Type 2 diabetes mellitus without complications: Secondary | ICD-10-CM | POA: Diagnosis not present

## 2016-07-21 DIAGNOSIS — N200 Calculus of kidney: Secondary | ICD-10-CM | POA: Diagnosis not present

## 2016-07-21 DIAGNOSIS — Z7984 Long term (current) use of oral hypoglycemic drugs: Secondary | ICD-10-CM | POA: Insufficient documentation

## 2016-07-21 DIAGNOSIS — N261 Atrophy of kidney (terminal): Secondary | ICD-10-CM | POA: Diagnosis not present

## 2016-07-21 DIAGNOSIS — I129 Hypertensive chronic kidney disease with stage 1 through stage 4 chronic kidney disease, or unspecified chronic kidney disease: Secondary | ICD-10-CM | POA: Diagnosis not present

## 2016-07-21 DIAGNOSIS — N132 Hydronephrosis with renal and ureteral calculous obstruction: Secondary | ICD-10-CM | POA: Diagnosis not present

## 2016-07-21 DIAGNOSIS — Z7982 Long term (current) use of aspirin: Secondary | ICD-10-CM | POA: Insufficient documentation

## 2016-07-21 DIAGNOSIS — Z833 Family history of diabetes mellitus: Secondary | ICD-10-CM | POA: Insufficient documentation

## 2016-07-21 DIAGNOSIS — N201 Calculus of ureter: Secondary | ICD-10-CM | POA: Diagnosis not present

## 2016-07-21 DIAGNOSIS — Z466 Encounter for fitting and adjustment of urinary device: Secondary | ICD-10-CM | POA: Diagnosis not present

## 2016-07-21 DIAGNOSIS — Z8249 Family history of ischemic heart disease and other diseases of the circulatory system: Secondary | ICD-10-CM | POA: Insufficient documentation

## 2016-07-21 DIAGNOSIS — N270 Small kidney, unilateral: Secondary | ICD-10-CM | POA: Diagnosis not present

## 2016-07-21 DIAGNOSIS — N189 Chronic kidney disease, unspecified: Secondary | ICD-10-CM | POA: Diagnosis not present

## 2016-07-21 DIAGNOSIS — R109 Unspecified abdominal pain: Secondary | ICD-10-CM | POA: Diagnosis not present

## 2016-07-21 DIAGNOSIS — R1084 Generalized abdominal pain: Secondary | ICD-10-CM | POA: Diagnosis not present

## 2016-07-21 DIAGNOSIS — Z823 Family history of stroke: Secondary | ICD-10-CM | POA: Diagnosis not present

## 2016-07-21 HISTORY — PX: CYSTOSCOPY W/ URETERAL STENT PLACEMENT: SHX1429

## 2016-07-21 LAB — BASIC METABOLIC PANEL
Anion gap: 10 (ref 5–15)
BUN: 27 mg/dL — ABNORMAL HIGH (ref 6–20)
CO2: 26 mmol/L (ref 22–32)
Calcium: 10 mg/dL (ref 8.9–10.3)
Chloride: 102 mmol/L (ref 101–111)
Creatinine, Ser: 1.04 mg/dL — ABNORMAL HIGH (ref 0.44–1.00)
GFR calc Af Amer: >60 mL/min (ref >60)
GFR calc non Af Amer: 57 mL/min — ABNORMAL LOW (ref >60)
Glucose, Bld: 159 mg/dL — ABNORMAL HIGH (ref 65–99)
Potassium: 3.8 mmol/L (ref 3.5–5.1)
Sodium: 138 mmol/L (ref 135–145)

## 2016-07-21 LAB — GLUCOSE, CAPILLARY
GLUCOSE-CAPILLARY: 166 mg/dL — AB (ref 65–99)
Glucose-Capillary: 125 mg/dL — ABNORMAL HIGH (ref 65–99)

## 2016-07-21 SURGERY — CYSTOSCOPY, WITH RETROGRADE PYELOGRAM AND URETERAL STENT INSERTION
Anesthesia: General | Site: Ureter | Laterality: Left

## 2016-07-21 MED ORDER — PROPOFOL 10 MG/ML IV BOLUS
INTRAVENOUS | Status: DC | PRN
  Administered 2016-07-21: 160 mg via INTRAVENOUS

## 2016-07-21 MED ORDER — SODIUM CHLORIDE 0.9 % IV SOLN
INTRAVENOUS | Status: DC | PRN
  Administered 2016-07-21: 20 mL

## 2016-07-21 MED ORDER — SODIUM CHLORIDE 0.9 % IR SOLN
Status: DC | PRN
  Administered 2016-07-21: 4000 mL

## 2016-07-21 MED ORDER — FENTANYL CITRATE (PF) 250 MCG/5ML IJ SOLN
INTRAMUSCULAR | Status: AC
  Filled 2016-07-21: qty 5

## 2016-07-21 MED ORDER — SUCCINYLCHOLINE CHLORIDE 200 MG/10ML IV SOSY
PREFILLED_SYRINGE | INTRAVENOUS | Status: DC | PRN
  Administered 2016-07-21: 120 mg via INTRAVENOUS

## 2016-07-21 MED ORDER — EPHEDRINE 5 MG/ML INJ
INTRAVENOUS | Status: AC
  Filled 2016-07-21: qty 10

## 2016-07-21 MED ORDER — DEXAMETHASONE SODIUM PHOSPHATE 10 MG/ML IJ SOLN
INTRAMUSCULAR | Status: AC
  Filled 2016-07-21: qty 1

## 2016-07-21 MED ORDER — CEFAZOLIN SODIUM-DEXTROSE 2-4 GM/100ML-% IV SOLN
2.0000 g | INTRAVENOUS | Status: AC
  Administered 2016-07-21: 2 g via INTRAVENOUS
  Filled 2016-07-21: qty 100

## 2016-07-21 MED ORDER — LIDOCAINE HCL (CARDIAC) 10 MG/ML IV SOLN
INTRAVENOUS | Status: DC | PRN
  Administered 2016-07-21: 100 mg via INTRAVENOUS

## 2016-07-21 MED ORDER — OXYCODONE HCL 5 MG PO TABS: 5.0000 mg | ORAL_TABLET | Freq: Once | ORAL | Status: DC | PRN

## 2016-07-21 MED ORDER — SUCCINYLCHOLINE CHLORIDE 200 MG/10ML IV SOSY
PREFILLED_SYRINGE | INTRAVENOUS | Status: AC
  Filled 2016-07-21: qty 10

## 2016-07-21 MED ORDER — FENTANYL CITRATE (PF) 100 MCG/2ML IJ SOLN: 25.0000 ug | INTRAMUSCULAR | Status: DC | PRN

## 2016-07-21 MED ORDER — ONDANSETRON HCL 4 MG/2ML IJ SOLN: 4.0000 mg | Freq: Four times a day (QID) | INTRAMUSCULAR | Status: DC | PRN

## 2016-07-21 MED ORDER — MIDAZOLAM HCL 5 MG/5ML IJ SOLN
INTRAMUSCULAR | Status: DC | PRN
  Administered 2016-07-21: 2 mg via INTRAVENOUS

## 2016-07-21 MED ORDER — ONDANSETRON HCL 4 MG/2ML IJ SOLN
INTRAMUSCULAR | Status: AC
  Filled 2016-07-21: qty 2

## 2016-07-21 MED ORDER — MIDAZOLAM HCL 2 MG/2ML IJ SOLN
INTRAMUSCULAR | Status: AC
  Filled 2016-07-21: qty 2

## 2016-07-21 MED ORDER — LIDOCAINE 2% (20 MG/ML) 5 ML SYRINGE
INTRAMUSCULAR | Status: AC
  Filled 2016-07-21: qty 5

## 2016-07-21 MED ORDER — ONDANSETRON HCL 4 MG/2ML IJ SOLN
INTRAMUSCULAR | Status: DC | PRN
  Administered 2016-07-21: 4 mg via INTRAVENOUS

## 2016-07-21 MED ORDER — CEPHALEXIN 500 MG PO CAPS: 500.0000 mg | ORAL_CAPSULE | Freq: Three times a day (TID) | ORAL | 0 refills | Status: DC

## 2016-07-21 MED ORDER — EPHEDRINE SULFATE 50 MG/ML IJ SOLN
INTRAMUSCULAR | Status: DC | PRN
  Administered 2016-07-21: 10 mg via INTRAVENOUS

## 2016-07-21 MED ORDER — 0.9 % SODIUM CHLORIDE (POUR BTL) OPTIME
TOPICAL | Status: DC | PRN
  Administered 2016-07-21: 1000 mL

## 2016-07-21 MED ORDER — OXYCODONE HCL 5 MG/5ML PO SOLN: 5.0000 mg | Freq: Once | ORAL | Status: DC | PRN

## 2016-07-21 MED ORDER — DEXAMETHASONE SODIUM PHOSPHATE 10 MG/ML IJ SOLN
INTRAMUSCULAR | Status: DC | PRN
  Administered 2016-07-21: 10 mg via INTRAVENOUS

## 2016-07-21 MED ORDER — FENTANYL CITRATE (PF) 100 MCG/2ML IJ SOLN
INTRAMUSCULAR | Status: DC | PRN
  Administered 2016-07-21: 100 ug via INTRAVENOUS

## 2016-07-21 MED ORDER — HYDROCODONE-ACETAMINOPHEN 5-325 MG PO TABS: 1.0000 | ORAL_TABLET | ORAL | 0 refills | Status: DC | PRN

## 2016-07-21 MED ORDER — LACTATED RINGERS IV SOLN
INTRAVENOUS | Status: DC
  Administered 2016-07-21: 17:00:00 via INTRAVENOUS

## 2016-07-21 MED ORDER — PROPOFOL 10 MG/ML IV BOLUS
INTRAVENOUS | Status: AC
  Filled 2016-07-21: qty 20

## 2016-07-21 SURGICAL SUPPLY — 10 items
BAG URO CATCHER STRL LF (MISCELLANEOUS) ×2 IMPLANT
CATH INTERMIT  6FR 70CM (CATHETERS) IMPLANT
CLOTH BEACON ORANGE TIMEOUT ST (SAFETY) ×2 IMPLANT
GLOVE BIO SURGEON STRL SZ7.5 (GLOVE) ×2 IMPLANT
GOWN STRL REUS W/TWL LRG LVL3 (GOWN DISPOSABLE) ×4 IMPLANT
GUIDEWIRE ANG ZIPWIRE 038X150 (WIRE) ×2 IMPLANT
GUIDEWIRE STR DUAL SENSOR (WIRE) IMPLANT
MANIFOLD NEPTUNE II (INSTRUMENTS) ×2 IMPLANT
PACK CYSTO (CUSTOM PROCEDURE TRAY) ×2 IMPLANT
TUBING CONNECTING 10 (TUBING) ×2 IMPLANT

## 2016-07-21 NOTE — Anesthesia Procedure Notes (Signed)
Procedure Name: Intubation Date/Time: 07/21/2016 6:03 PM Performed by: Noralyn Pick D Pre-anesthesia Checklist: Patient identified, Emergency Drugs available, Suction available and Patient being monitored Patient Re-evaluated:Patient Re-evaluated prior to inductionOxygen Delivery Method: Circle system utilized Preoxygenation: Pre-oxygenation with 100% oxygen Intubation Type: IV induction and Rapid sequence Laryngoscope Size: Mac and 3 Grade View: Grade I Tube type: Oral Number of attempts: 1 Airway Equipment and Method: Stylet Placement Confirmation: ETT inserted through vocal cords under direct vision,  positive ETCO2 and breath sounds checked- equal and bilateral Secured at: 21 cm Tube secured with: Tape Dental Injury: Teeth and Oropharynx as per pre-operative assessment

## 2016-07-21 NOTE — Discharge Instructions (Signed)

## 2016-07-21 NOTE — Anesthesia Preprocedure Evaluation (Signed)
Anesthesia Evaluation  Patient identified by MRN, date of birth, ID band Patient awake    Reviewed: Allergy & Precautions, H&P , NPO status , Patient's Chart, lab work & pertinent test results  Airway Mallampati: II   Neck ROM: full    Dental   Pulmonary neg pulmonary ROS,    breath sounds clear to auscultation       Cardiovascular hypertension,  Rhythm:regular Rate:Normal     Neuro/Psych  Headaches,    GI/Hepatic   Endo/Other  diabetes, Type 2  Renal/GU Renal diseasestones     Musculoskeletal   Abdominal   Peds  Hematology   Anesthesia Other Findings   Reproductive/Obstetrics                             Anesthesia Physical Anesthesia Plan  ASA: II  Anesthesia Plan: General   Post-op Pain Management:    Induction: Intravenous  Airway Management Planned: Oral ETT  Additional Equipment:   Intra-op Plan:   Post-operative Plan: Extubation in OR  Informed Consent: I have reviewed the patients History and Physical, chart, labs and discussed the procedure including the risks, benefits and alternatives for the proposed anesthesia with the patient or authorized representative who has indicated his/her understanding and acceptance.     Plan Discussed with: CRNA, Anesthesiologist and Surgeon  Anesthesia Plan Comments:         Anesthesia Quick Evaluation

## 2016-07-21 NOTE — Transfer of Care (Signed)
Immediate Anesthesia Transfer of Care Note  Patient: Madison Gill  Procedure(s) Performed: Procedure(s): CYSTOSCOPY WITH RETROGRADE PYELOGRAM/URETERAL STENT PLACEMENT (Left)  Patient Location: PACU  Anesthesia Type:General  Level of Consciousness: awake, alert  and oriented  Airway & Oxygen Therapy: Patient Spontanous Breathing and Patient connected to face mask oxygen  Post-op Assessment: Report given to RN and Post -op Vital signs reviewed and stable  Post vital signs: Reviewed and stable  Last Vitals:  Vitals:   07/21/16 1700  BP: 126/77  Pulse: 89  Resp: 18  Temp: 36.7 C    Last Pain:  Vitals:   07/21/16 1728  TempSrc:   PainSc: 4       Patients Stated Pain Goal: 3 (85/90/93 1121)  Complications: No apparent anesthesia complications

## 2016-07-21 NOTE — Op Note (Signed)
Date of procedure: 07/21/16  Preoperative diagnosis:  1. Left ureteral calculus 2. Right atrophic kidney   Postoperative diagnosis:  1. Same   Procedure: 1. Cystoscopy 2. Left retrograde pyelogram with interpretation 3. Left ureteral stent placement 6 French by 26 cm  Surgeon: Baruch Gouty, MD  Anesthesia: General  Complications: None  Intraoperative findings: The patient was found to have proximal hydroureteronephrosis on the left side with retrograde pyelogram. Left ureteral stent was successfully placed with drainage of clear yellow urine.  EBL: None  Specimens: None  Drains: 6 French by 26 cm left double-J ureteral stent  Disposition: Stable to the postanesthesia care unit  Indication for procedure: The patient is a 61 y.o. female with history of a right atrophic kidney who presented with left ureteral calculus proximal hydronephrosis. She presents today for urgent stent placement due to her obstructed solitary kidney. Of note, the patient was making urine prior to surgery and was only symptomatically for a few hours..  After reviewing the management options for treatment, the patient elected to proceed with the above surgical procedure(s). We have discussed the potential benefits and risks of the procedure, side effects of the proposed treatment, the likelihood of the patient achieving the goals of the procedure, and any potential problems that might occur during the procedure or recuperation. Informed consent has been obtained.  Description of procedure: The patient was met in the preoperative area. All risks, benefits, and indications of the procedure were described in great detail. The patient consented to the procedure. Preoperative antibiotics were given. The patient was taken to the operative theater. General anesthesia was induced per the anesthesia service. The patient was then placed in the dorsal lithotomy position and prepped and draped in the usual sterile fashion.  A preoperative timeout was called.   The 21 French 30 cystoscope was inserted in the patient's bladder per urethra atraumatically. The left ureteral orifice was visualized and a left retrograde pyelogram was obtained with the aid of an open-ended catheter. There's proximal hydroureteronephrosis noted. A sensor wire was then advanced to the level left renal pelvis under fluoroscopy. A 6 French by 26 cm double-J ureteral stent was in place. The sensor wire was removed. A curl was seen in the patient's left renal pelvis on fluoroscopy and in the urinary bladder vitrectomy visualization. There is drainage of clear yellow urine at this time. The patient's bladder was then drained and she was woken from anesthesia and transferred in stable condition to the postanesthesia care unit.  Plan: The patient will follow-up with her primary urologist, Dr. Tresa Moore, as scheduled next week to discuss definitive stone management.  Baruch Gouty, M.D.

## 2016-07-21 NOTE — H&P (Addendum)
CC/HPI: 1 - Recurrent Nephrolithiasis - long h/o kidney stones previously treated with stent / SWL, medical therapy   09/2014 - Left URS to stone free on left, had 69mm Ureteral and several small intrarenal stones in functionally solitary kidney.   Recent Surveillance:   06/2015 - KUB/Renal US likely scattered parenchymal calcifications, stable bilat cysts , Cr 1.02, lytes wnl.   2 - Right Atrophic Kidney - nearly completely atrophic right kidney by imaging x several with left sided compensatory hypertrophy. Cr normal 2016 (0.9).   3 - Metabolic Stone Disease / Hypercalciuria   2011- partial eval wtih normal CMP, Uric Acid, Composition 100% CaOx  2016 - BMP,PTH,Rate - normal; Composition - 100% CaOx; 24 Hr Urines - elevated urinary calcium (450!) ==> indapamide 2.5 QD   4- Non-complex Renal Cysts - multifocal Lt>Rt renal cysts on imaging x several. Most recently 18cm left upper and scattered bilateral small cysts, no nodules.     CC: I have pain in the flank.  HPI: Madison Gill is a 61 year-old female established patient who is here for flank pain.  Because of her issues of diabetes, the past month, her Anastasio Auerbach has been increased and as a result she developed a yeast infection treated by her gynecologist last week. I saw her for a similar presentation back in November. This morning she woke with acute left-sided radiating flank pain. She has not seen any gross hematuria. Denies fevers.   The problem is on the left side. Her pain started about approximately 07/20/2016. The pain is dull. The intensity of her pain is rated as a 7. The pain is constant. The pain does radiate.   None< makes the pain better. Walking, coughing, sitting , standing, lifting, and twisting makes the pain worse.   She has had this same pain previously. She has had kidney stones.     ALLERGIES: Codeine Derivatives HTN Complex CAPS Lipitor TABS    MEDICATIONS: Aspirin 81 MG TABS Oral  Coreg 25 mg tablet Oral   Fenofibrate 200 MG CAPS Oral  Indapamide 2.5 mg tablet 0 Oral Daily  Invokana 300 mg tablet  Losartan Potassium 25 mg tablet Oral  Metformin Hcl 500 mg tablet Oral     GU PSH: Cysto Uretero Lithotripsy - 10/18/2014 Cystoscopy Insert Stent - 10/18/2014, 09/20/2014 D&C Non-OB - 2008      PSH Notes: Cystoscopy With Ureteroscopy With Lithotripsy, Cystoscopy With Insertion Of Ureteral Stent Left, Cystoscopy With Insertion Of Ureteral Stent Left, Dilation And Curettage, Gallbladder Surgery, Shoulder Surgery   NON-GU PSH: None   GU PMH: Dysuria - 03/12/2016 Flank Pain - 03/12/2016 Atrophy of kidney, Atrophic kidney, acquired - 06/29/2015 Kidney Stone, Nephrolithiasis - 06/29/2015 Renal Cysts, Simple, Renal cyst, acquired, left - 06/29/2015, Renal cyst, acquired, - 12/18/2014 Other microscopic hematuria, Microscopic hematuria - 08/31/2014 Calculus Ureter, Ureteral Stone - 2014    NON-GU PMH: Unspecified disorder of calcium metabolism, Hypercalciuria - 06/29/2015 Encounter for general adult medical examination without abnormal findings, Encounter for preventive health examination - 09/12/2014 Personal history of other diseases of the circulatory system, History of hypertension - 2014    FAMILY HISTORY: Diabetes - Mother Family Health Status Number - Runs In Family Hypertension - Father Ischemic Stroke - Father Urologic Disorder - Father, Brother   SOCIAL HISTORY: Marital Status: Married Current Smoking Status: Patient has never smoked.  Does not drink anymore.  Drinks 1 caffeinated drink per day.     Notes: Never A Smoker, Marital History - Currently Married, Alcohol Use,  Tobacco Use, Occupation:   REVIEW OF SYSTEMS:    GU Review Female:   Patient reports frequent urination and burning /pain with urination. Patient denies hard to postpone urination, get up at night to urinate, leakage of urine, stream starts and stops, trouble starting your stream, have to strain to urinate, and currently  pregnant.  Gastrointestinal (Upper):   Patient reports nausea. Patient denies vomiting and indigestion/ heartburn.  Gastrointestinal (Lower):   Patient denies diarrhea and constipation.  Constitutional:   Patient reports fatigue. Patient denies fever, weight loss, and night sweats.  Skin:   Patient denies skin rash/ lesion and itching.  Eyes:   Patient denies blurred vision and double vision.  Ears/ Nose/ Throat:   Patient denies sore throat and sinus problems.  Hematologic/Lymphatic:   Patient denies swollen glands and easy bruising.  Cardiovascular:   Patient denies leg swelling and chest pains.  Respiratory:   Patient denies cough and shortness of breath.  Endocrine:   Patient denies excessive thirst.  Musculoskeletal:   Patient denies back pain and joint pain.  Neurological:   Patient denies headaches and dizziness.  Psychologic:   Patient denies depression and anxiety.   VITAL SIGNS:      07/21/2016 02:58 PM  Weight 155 lb / 70.31 kg  Height 64.5 in / 163.83 cm  BP 132/86 mmHg  Pulse 89 /min  BMI 26.2 kg/m   MULTI-SYSTEM PHYSICAL EXAMINATION:    Respiratory: No labored breathing, no use of accessory muscles. CTA.  Cardiovascular: Normal temperature, normal extremity pulses, no swelling, no varicosities. RRR, no murmur.  Skin: No paleness, no jaundice, no cyanosis. No lesion, no ulcer, no rash.  Neurologic / Psychiatric: Oriented to time, oriented to place, oriented to person. No depression, no anxiety, no agitation.  Gastrointestinal: No mass, no tenderness, no rigidity, non obese abdomen. Mild left flank tenderness.  Musculoskeletal: Spine, ribs, pelvis no bilateral tenderness. Normal gait and station of head and neck.     PAST DATA REVIEWED:  Source Of History:  Patient  Records Review:   Previous Patient Records  Urine Test Review:   Urinalysis, Urine Culture  X-Ray Review: KUB: Reviewed Films.  Renal Ultrasound: Reviewed Films.  C.T. Stone Protocol: Reviewed Films.      07/21/16  Urinalysis  Urine Appearance Clear   Urine Color Straw   Urine Glucose 3+   Urine Bilirubin Neg   Urine Ketones Neg   Urine Specific Gravity 1.010   Urine Blood 1+   Urine pH 5.5   Urine Protein Neg   Urine Urobilinogen 0.2   Urine Nitrites Neg   Urine Leukocyte Esterase Neg   Urine WBC/hpf NS (Not Seen)   Urine RBC/hpf 0 - 2/hpf   Urine Epithelial Cells NS (Not Seen)   Urine Bacteria NS (Not Seen)   Urine Mucous Not Present   Urine Yeast NS (Not Seen)   Urine Trichomonas Not Present   Urine Cystals NS (Not Seen)   Urine Casts NS (Not Seen)   Urine Sperm Not Present    PROCEDURES:         C.T. Urogram - [id number]  1. Obstructing 5 mm mid left ureteral calculus. 2. Nonobstructing bilateral renal calculi. 3. Bilateral renal cysts and chronic right renal cortical thinning are similar to previous studies.                Urinalysis w/Scope Dipstick Dipstick Cont'd Micro  Color: Straw Bilirubin: Neg WBC/hpf: NS (Not Seen)  Appearance: Clear Ketones: Neg RBC/hpf:  0 - 2/hpf  Specific Gravity: 1.010 Blood: 1+ Bacteria: NS (Not Seen)  pH: 5.5 Protein: Neg Cystals: NS (Not Seen)  Glucose: 3+ Urobilinogen: 0.2 Casts: NS (Not Seen)    Nitrites: Neg Trichomonas: Not Present    Leukocyte Esterase: Neg Mucous: Not Present      Epithelial Cells: NS (Not Seen)      Yeast: NS (Not Seen)      Sperm: Not Present    ASSESSMENT:      ICD-10 Details  1 GU:   Flank Pain - R10.84 Acute  2   Kidney Stone - N20.0 Left, Acute  3   Renal Atrophy - N27.0 Right, Stable   PLAN:            Medications Stop Meds: Bydureon Pen 2 mg/0.65 ml pen injector Subcutaneous  Start: 08/31/2014  Discontinue: 07/21/2016  - Reason: The medication cycle was completed.            Orders Labs Urine Culture  X-Rays: C.T. Stone Protocol Without Contrast  X-Ray Notes: ...          Schedule Return Visit/Planned Activity: 1 Week - Follow up MD          Document Letter(s):  Created for  Patient: Clinical Summary         Notes:   Pt with obstructing ureteral stone in the left proximal ureter in functionally solitary kidney. Due to her known atrophic right kidney, we will plan for cysto, left stent placement this evening. She had this procedure before. Risks and benefits discussed. Understanding expressed. She will need to f/u with her primary urologist, Dr Tresa Moore, next week for discussion about definitive stone management.

## 2016-07-22 ENCOUNTER — Encounter (HOSPITAL_COMMUNITY): Payer: Self-pay | Admitting: Urology

## 2016-07-22 MED FILL — HYDROCODON-APAP 5-325: 5-325 | 5 days supply | Qty: 30 | Fill #0

## 2016-07-22 MED FILL — CEPHALEXIN 500 MG CAPSULE: 500 | 2 days supply | Qty: 6 | Fill #0

## 2016-07-24 ENCOUNTER — Encounter (HOSPITAL_COMMUNITY): Payer: Self-pay | Admitting: Urology

## 2016-07-24 NOTE — Anesthesia Postprocedure Evaluation (Signed)
Anesthesia Post Note  Patient: Madison Gill  Procedure(s) Performed: Procedure(s) (LRB): CYSTOSCOPY WITH RETROGRADE PYELOGRAM/URETERAL STENT PLACEMENT (Left)  Patient location during evaluation: PACU Anesthesia Type: General Level of consciousness: awake and alert and patient cooperative Pain management: pain level controlled Vital Signs Assessment: post-procedure vital signs reviewed and stable Respiratory status: spontaneous breathing and respiratory function stable Cardiovascular status: stable Anesthetic complications: no       Last Vitals:  Vitals:   07/21/16 1908 07/21/16 1935  BP:  128/76  Pulse: 78 76  Resp: 16 16  Temp:  36.6 C    Last Pain:  Vitals:   07/21/16 1728  TempSrc:   PainSc: [place]

## 2016-07-28 DIAGNOSIS — N261 Atrophy of kidney (terminal): Secondary | ICD-10-CM | POA: Diagnosis not present

## 2016-07-28 DIAGNOSIS — N200 Calculus of kidney: Secondary | ICD-10-CM | POA: Diagnosis not present

## 2016-07-28 DIAGNOSIS — N281 Cyst of kidney, acquired: Secondary | ICD-10-CM | POA: Diagnosis not present

## 2016-07-30 ENCOUNTER — Other Ambulatory Visit: Payer: Self-pay | Admitting: Urology

## 2016-07-31 ENCOUNTER — Other Ambulatory Visit: Payer: Self-pay | Admitting: Obstetrics & Gynecology

## 2016-07-31 DIAGNOSIS — N898 Other specified noninflammatory disorders of vagina: Secondary | ICD-10-CM | POA: Diagnosis not present

## 2016-08-01 MED FILL — CLOBETASOL 0.05% OINTMENT: 0.05 | 7 days supply | Qty: 30 | Fill #0

## 2016-08-01 MED FILL — CLOTRIMAZOLE-BETAMETHASONE: 1-0.05 | 14 days supply | Qty: 45 | Fill #0

## 2016-08-01 MED FILL — FLUCONAZOLE 150 MG TABLET: 150 | 6 days supply | Qty: 2 | Fill #0

## 2016-08-07 DIAGNOSIS — B373 Candidiasis of vulva and vagina: Secondary | ICD-10-CM | POA: Diagnosis not present

## 2016-08-07 MED FILL — KETOCONAZOLE 200 MG TABLET: 200 | 5 days supply | Qty: 10 | Fill #0

## 2016-08-09 DIAGNOSIS — J029 Acute pharyngitis, unspecified: Secondary | ICD-10-CM | POA: Diagnosis not present

## 2016-08-09 DIAGNOSIS — J309 Allergic rhinitis, unspecified: Secondary | ICD-10-CM | POA: Diagnosis not present

## 2016-08-11 ENCOUNTER — Encounter (HOSPITAL_BASED_OUTPATIENT_CLINIC_OR_DEPARTMENT_OTHER): Payer: Self-pay | Admitting: *Deleted

## 2016-08-11 MED FILL — KETOROLAC 10 MG TABLET: 10 | 5 days supply | Qty: 20 | Fill #0

## 2016-08-11 MED FILL — OXYBUTYNIN 5 MG TABLET: 5 | 30 days supply | Qty: 90 | Fill #0

## 2016-08-11 NOTE — Progress Notes (Addendum)
NPO AFTER MN W/ EXCEPTION CLEAR LIQUIDS UNTIL 0800 (NO CREAM/ MILK PRODUCTS).  ARRIVE AT 1230.  NEEDS ISTAT 8 AND EKG.  WILL TAKE COZAAR AND COREG AM DOS W/ SIPS OF WATER AND IF NEEDED TAKE HYDROCODONE.  ADDENDUM:  CALLED AND LM VIA PHONE FOR PT ABOUT TIME CHANGE OF SURGERY.  ARRIVE AT 1130 AND CLEAR LIQUIDS UNTIL 0700, ASKED PT TO CALL BACK FOR CONFIRMATION WAS RECEIVED.

## 2016-08-15 ENCOUNTER — Ambulatory Visit (HOSPITAL_BASED_OUTPATIENT_CLINIC_OR_DEPARTMENT_OTHER)
Admission: RE | Admit: 2016-08-15 | Discharge: 2016-08-15 | Disposition: A | Discharge status: Home / Self Care | Payer: 59 | Source: Ambulatory Visit | Attending: Urology | Admitting: Urology

## 2016-08-15 ENCOUNTER — Encounter (HOSPITAL_BASED_OUTPATIENT_CLINIC_OR_DEPARTMENT_OTHER)
Admission: RE | Disposition: A | Discharge status: Home / Self Care | Payer: Self-pay | Source: Ambulatory Visit | Attending: Urology

## 2016-08-15 ENCOUNTER — Encounter (HOSPITAL_BASED_OUTPATIENT_CLINIC_OR_DEPARTMENT_OTHER): Payer: Self-pay | Admitting: *Deleted

## 2016-08-15 ENCOUNTER — Ambulatory Visit (HOSPITAL_BASED_OUTPATIENT_CLINIC_OR_DEPARTMENT_OTHER): Payer: 59 | Admitting: Anesthesiology

## 2016-08-15 ENCOUNTER — Other Ambulatory Visit: Payer: Self-pay

## 2016-08-15 DIAGNOSIS — Z87442 Personal history of urinary calculi: Secondary | ICD-10-CM | POA: Diagnosis not present

## 2016-08-15 DIAGNOSIS — N261 Atrophy of kidney (terminal): Secondary | ICD-10-CM | POA: Diagnosis not present

## 2016-08-15 DIAGNOSIS — Q600 Renal agenesis, unilateral: Secondary | ICD-10-CM | POA: Diagnosis not present

## 2016-08-15 DIAGNOSIS — N202 Calculus of kidney with calculus of ureter: Secondary | ICD-10-CM | POA: Diagnosis not present

## 2016-08-15 DIAGNOSIS — E1122 Type 2 diabetes mellitus with diabetic chronic kidney disease: Secondary | ICD-10-CM | POA: Insufficient documentation

## 2016-08-15 DIAGNOSIS — N182 Chronic kidney disease, stage 2 (mild): Secondary | ICD-10-CM | POA: Insufficient documentation

## 2016-08-15 DIAGNOSIS — E781 Pure hyperglyceridemia: Secondary | ICD-10-CM | POA: Insufficient documentation

## 2016-08-15 DIAGNOSIS — I129 Hypertensive chronic kidney disease with stage 1 through stage 4 chronic kidney disease, or unspecified chronic kidney disease: Secondary | ICD-10-CM | POA: Diagnosis not present

## 2016-08-15 DIAGNOSIS — Z466 Encounter for fitting and adjustment of urinary device: Secondary | ICD-10-CM | POA: Diagnosis not present

## 2016-08-15 DIAGNOSIS — N200 Calculus of kidney: Secondary | ICD-10-CM | POA: Diagnosis not present

## 2016-08-15 HISTORY — PX: HOLMIUM LASER APPLICATION: SHX5852

## 2016-08-15 HISTORY — DX: Calculus of kidney: N20.0

## 2016-08-15 HISTORY — DX: Disorder of kidney and ureter, unspecified: N28.9

## 2016-08-15 HISTORY — DX: Chronic kidney disease, stage 2 (mild): N18.2

## 2016-08-15 HISTORY — PX: CYSTOSCOPY WITH RETROGRADE PYELOGRAM, URETEROSCOPY AND STENT PLACEMENT: SHX5789

## 2016-08-15 HISTORY — DX: Calculus of ureter: N20.1

## 2016-08-15 HISTORY — DX: Urgency of urination: R39.15

## 2016-08-15 HISTORY — DX: Atrophy of kidney (terminal): N26.1

## 2016-08-15 HISTORY — DX: Type 2 diabetes mellitus without complications: E11.9

## 2016-08-15 HISTORY — DX: Cyst of kidney, acquired: N28.1

## 2016-08-15 HISTORY — DX: Personal history of other complications of pregnancy, childbirth and the puerperium: Z87.59

## 2016-08-15 LAB — POCT I-STAT, CHEM 8
BUN: 26 mg/dL — ABNORMAL HIGH (ref 6–20)
CALCIUM ION: 1.26 mmol/L (ref 1.15–1.40)
CHLORIDE: 106 mmol/L (ref 101–111)
Creatinine, Ser: 0.7 mg/dL (ref 0.44–1.00)
Glucose, Bld: 187 mg/dL — ABNORMAL HIGH (ref 65–99)
HEMATOCRIT: 47 % — AB (ref 36.0–46.0)
Hemoglobin: 16 g/dL — ABNORMAL HIGH (ref 12.0–15.0)
Potassium: 3.9 mmol/L (ref 3.5–5.1)
SODIUM: 140 mmol/L (ref 135–145)
TCO2: 26 mmol/L (ref 0–100)

## 2016-08-15 LAB — GLUCOSE, CAPILLARY: Glucose-Capillary: 160 mg/dL — ABNORMAL HIGH (ref 65–99)

## 2016-08-15 SURGERY — CYSTOURETEROSCOPY, WITH RETROGRADE PYELOGRAM AND STENT INSERTION
Anesthesia: General | Site: Ureter | Laterality: Left

## 2016-08-15 MED ORDER — MIDAZOLAM HCL 5 MG/5ML IJ SOLN
INTRAMUSCULAR | Status: DC | PRN
  Administered 2016-08-15: 2 mg via INTRAVENOUS

## 2016-08-15 MED ORDER — ONDANSETRON HCL 4 MG/2ML IJ SOLN
INTRAMUSCULAR | Status: DC | PRN
  Administered 2016-08-15: 4 mg via INTRAVENOUS

## 2016-08-15 MED ORDER — CEPHALEXIN 500 MG PO CAPS: 500.0000 mg | ORAL_CAPSULE | Freq: Two times a day (BID) | ORAL | 0 refills | Status: AC

## 2016-08-15 MED ORDER — GENTAMICIN IN SALINE 1.6-0.9 MG/ML-% IV SOLN
80.0000 mg | INTRAVENOUS | Status: DC
  Filled 2016-08-15: qty 50

## 2016-08-15 MED ORDER — LACTATED RINGERS IV SOLN
INTRAVENOUS | Status: DC
  Administered 2016-08-15: 13:00:00 via INTRAVENOUS
  Filled 2016-08-15: qty 1000

## 2016-08-15 MED ORDER — HYDROMORPHONE HCL 1 MG/ML IJ SOLN
0.2500 mg | INTRAMUSCULAR | Status: DC | PRN
  Filled 2016-08-15: qty 0.5

## 2016-08-15 MED ORDER — GENTAMICIN SULFATE 40 MG/ML IJ SOLN
5.0000 mg/kg | Freq: Once | INTRAVENOUS | Status: AC
  Administered 2016-08-15: 310 mg via INTRAVENOUS
  Filled 2016-08-15: qty 7.75

## 2016-08-15 MED ORDER — KETOROLAC TROMETHAMINE 30 MG/ML IJ SOLN
INTRAMUSCULAR | Status: AC
  Filled 2016-08-15: qty 1

## 2016-08-15 MED ORDER — LIDOCAINE 2% (20 MG/ML) 5 ML SYRINGE
INTRAMUSCULAR | Status: AC
  Filled 2016-08-15: qty 5

## 2016-08-15 MED ORDER — LIDOCAINE 2% (20 MG/ML) 5 ML SYRINGE
INTRAMUSCULAR | Status: DC | PRN
  Administered 2016-08-15: 100 mg via INTRAVENOUS

## 2016-08-15 MED ORDER — PROPOFOL 10 MG/ML IV BOLUS
INTRAVENOUS | Status: AC
  Filled 2016-08-15: qty 20

## 2016-08-15 MED ORDER — DEXAMETHASONE SODIUM PHOSPHATE 10 MG/ML IJ SOLN
INTRAMUSCULAR | Status: DC | PRN
  Administered 2016-08-15: 5 mg via INTRAVENOUS

## 2016-08-15 MED ORDER — MIDAZOLAM HCL 2 MG/2ML IJ SOLN
INTRAMUSCULAR | Status: AC
  Filled 2016-08-15: qty 2

## 2016-08-15 MED ORDER — FENTANYL CITRATE (PF) 100 MCG/2ML IJ SOLN
INTRAMUSCULAR | Status: AC
  Filled 2016-08-15: qty 2

## 2016-08-15 MED ORDER — SODIUM CHLORIDE 0.9 % IR SOLN
Status: DC | PRN
  Administered 2016-08-15: 4000 mL
  Administered 2016-08-15: 500 mL

## 2016-08-15 MED ORDER — HYDROCODONE-ACETAMINOPHEN 5-325 MG PO TABS: 1.0000 | ORAL_TABLET | ORAL | 0 refills | Status: AC | PRN

## 2016-08-15 MED ORDER — DEXAMETHASONE SODIUM PHOSPHATE 10 MG/ML IJ SOLN
INTRAMUSCULAR | Status: AC
  Filled 2016-08-15: qty 1

## 2016-08-15 MED ORDER — MEPERIDINE HCL 25 MG/ML IJ SOLN
6.2500 mg | INTRAMUSCULAR | Status: DC | PRN
  Filled 2016-08-15: qty 1

## 2016-08-15 MED ORDER — IOHEXOL 300 MG/ML  SOLN
INTRAMUSCULAR | Status: DC | PRN
  Administered 2016-08-15: 10 mL

## 2016-08-15 MED ORDER — FENTANYL CITRATE (PF) 100 MCG/2ML IJ SOLN
INTRAMUSCULAR | Status: DC | PRN
  Administered 2016-08-15 (×2): 50 ug via INTRAVENOUS

## 2016-08-15 MED ORDER — SENNOSIDES-DOCUSATE SODIUM 8.6-50 MG PO TABS: 1.0000 | ORAL_TABLET | Freq: Two times a day (BID) | ORAL | 0 refills | Status: AC

## 2016-08-15 MED ORDER — ONDANSETRON HCL 4 MG/2ML IJ SOLN
INTRAMUSCULAR | Status: AC
  Filled 2016-08-15: qty 2

## 2016-08-15 MED ORDER — PROPOFOL 10 MG/ML IV BOLUS
INTRAVENOUS | Status: DC | PRN
  Administered 2016-08-15: 150 mg via INTRAVENOUS

## 2016-08-15 MED ORDER — ONDANSETRON HCL 4 MG/2ML IJ SOLN
4.0000 mg | Freq: Once | INTRAMUSCULAR | Status: DC | PRN
  Filled 2016-08-15: qty 2

## 2016-08-15 MED FILL — HYDROCODON-APAP 5-325: 5-325 | 2 days supply | Qty: 20 | Fill #0

## 2016-08-15 MED FILL — CEPHALEXIN 500 MG CAPSULE: 500 | 3 days supply | Qty: 6 | Fill #0

## 2016-08-15 SURGICAL SUPPLY — 38 items
BAG DRAIN URO-CYSTO SKYTR STRL (DRAIN) ×2 IMPLANT
BASKET DAKOTA 1.9FR 11X120 (BASKET) IMPLANT
BASKET LASER NITINOL 1.9FR (BASKET) ×2 IMPLANT
BASKET STNLS GEMINI 4WIRE 3FR (BASKET) IMPLANT
BASKET ZERO TIP NITINOL 2.4FR (BASKET) IMPLANT
CATH INTERMIT  6FR 70CM (CATHETERS) ×2 IMPLANT
CATH URET 5FR 28IN CONE TIP (BALLOONS) ×1
CATH URET 5FR 28IN OPEN ENDED (CATHETERS) IMPLANT
CATH URET 5FR 70CM CONE TIP (BALLOONS) ×1 IMPLANT
CLOTH BEACON ORANGE TIMEOUT ST (SAFETY) ×2 IMPLANT
ELECT REM PT RETURN 9FT ADLT (ELECTROSURGICAL)
ELECTRODE REM PT RTRN 9FT ADLT (ELECTROSURGICAL) IMPLANT
FIBER LASER FLEXIVA 365 (UROLOGICAL SUPPLIES) IMPLANT
FIBER LASER TRAC TIP (UROLOGICAL SUPPLIES) ×2 IMPLANT
GLOVE BIO SURGEON STRL SZ7.5 (GLOVE) ×2 IMPLANT
GOWN STRL REUS W/ TWL LRG LVL3 (GOWN DISPOSABLE) ×2 IMPLANT
GOWN STRL REUS W/ TWL XL LVL3 (GOWN DISPOSABLE) ×1 IMPLANT
GOWN STRL REUS W/TWL LRG LVL3 (GOWN DISPOSABLE) ×2
GOWN STRL REUS W/TWL XL LVL3 (GOWN DISPOSABLE) ×1
GUIDEWIRE 0.038 PTFE COATED (WIRE) IMPLANT
GUIDEWIRE ANG ZIPWIRE 038X150 (WIRE) ×2 IMPLANT
GUIDEWIRE STR DUAL SENSOR (WIRE) ×2 IMPLANT
IV NS 1000ML (IV SOLUTION) ×1
IV NS 1000ML BAXH (IV SOLUTION) ×1 IMPLANT
IV NS IRRIG 3000ML ARTHROMATIC (IV SOLUTION) ×4 IMPLANT
KIT BALLIN UROMAX 15FX10 (LABEL) IMPLANT
KIT BALLN UROMAX 15FX4 (MISCELLANEOUS) IMPLANT
KIT BALLN UROMAX 26 75X4 (MISCELLANEOUS)
KIT RM TURNOVER CYSTO AR (KITS) ×2 IMPLANT
MANIFOLD NEPTUNE II (INSTRUMENTS) ×2 IMPLANT
NS IRRIG 500ML POUR BTL (IV SOLUTION) ×2 IMPLANT
PACK CYSTO (CUSTOM PROCEDURE TRAY) ×2 IMPLANT
SET HIGH PRES BAL DIL (LABEL)
STENT POLARIS 5FRX22 (STENTS) ×2 IMPLANT
SYRINGE 10CC LL (SYRINGE) ×2 IMPLANT
SYRINGE IRR TOOMEY STRL 70CC (SYRINGE) IMPLANT
TUBE CONNECTING 12X1/4 (SUCTIONS) ×2 IMPLANT
TUBE FEEDING 8FR 16IN STR KANG (MISCELLANEOUS) ×2 IMPLANT

## 2016-08-15 NOTE — Anesthesia Preprocedure Evaluation (Signed)
Anesthesia Evaluation  Patient identified by MRN, date of birth, ID band Patient awake    Reviewed: Allergy & Precautions, NPO status , Patient's Chart, lab work & pertinent test results  Airway Mallampati: I  TM Distance: >3 FB Neck ROM: Full    Dental   Pulmonary    Pulmonary exam normal        Cardiovascular hypertension, Pt. on medications Normal cardiovascular exam     Neuro/Psych    GI/Hepatic   Endo/Other  diabetes  Renal/GU      Musculoskeletal   Abdominal   Peds  Hematology   Anesthesia Other Findings   Reproductive/Obstetrics                             Anesthesia Physical Anesthesia Plan  ASA: II  Anesthesia Plan: General   Post-op Pain Management:    Induction: Intravenous  Airway Management Planned: LMA  Additional Equipment:   Intra-op Plan:   Post-operative Plan: Extubation in OR  Informed Consent: I have reviewed the patients History and Physical, chart, labs and discussed the procedure including the risks, benefits and alternatives for the proposed anesthesia with the patient or authorized representative who has indicated his/her understanding and acceptance.     Plan Discussed with: CRNA and Surgeon  Anesthesia Plan Comments:         Anesthesia Quick Evaluation

## 2016-08-15 NOTE — Interval H&P Note (Signed)
History and Physical Interval Note:  08/15/2016 12:40 PM  Madison Gill  has presented today for surgery, with the diagnosis of LEFT URETERAL AND RENAL STONES  The various methods of treatment have been discussed with the patient and family. After consideration of risks, benefits and other options for treatment, the patient has consented to  Procedure(s): CYSTOSCOPY WITH RETROGRADE PYELOGRAM, URETEROSCOPY AND STENT REPLACEMENT (Left) HOLMIUM LASER APPLICATION (Left) as a surgical intervention .  The patient's history has been reviewed, patient examined, no change in status, stable for surgery.  I have reviewed the patient's chart and labs.  Questions were answered to the patient's satisfaction.     Darbi Chandran

## 2016-08-15 NOTE — Anesthesia Procedure Notes (Signed)
Procedure Name: LMA Insertion Date/Time: 08/15/2016 1:06 PM Performed by: Bethena Roys T Pre-anesthesia Checklist: Patient identified, Emergency Drugs available, Suction available and Patient being monitored Patient Re-evaluated:Patient Re-evaluated prior to inductionOxygen Delivery Method: Circle system utilized Preoxygenation: Pre-oxygenation with 100% oxygen Intubation Type: IV induction Ventilation: Mask ventilation without difficulty LMA: LMA inserted LMA Size: 4.0 Number of attempts: 1 Airway Equipment and Method: Bite block Placement Confirmation: positive ETCO2 Tube secured with: Tape Dental Injury: Teeth and Oropharynx as per pre-operative assessment

## 2016-08-15 NOTE — Brief Op Note (Signed)
08/15/2016  1:44 PM  PATIENT:  Madison Gill  61 y.o. female  PRE-OPERATIVE DIAGNOSIS:  LEFT URETERAL AND RENAL STONES  POST-OPERATIVE DIAGNOSIS:  LEFT URETERAL AND RENAL STONES  PROCEDURE:  Procedure(s): CYSTOSCOPY WITH RETROGRADE PYELOGRAM, URETEROSCOPY AND STENT REPLACEMENT (Left) HOLMIUM LASER APPLICATION (Left)  SURGEON:  Surgeon(s) and Role:    * Alexis Frock, MD - Primary  PHYSICIAN ASSISTANT:   ASSISTANTS: none   ANESTHESIA:   general  EBL:  Total I/O In: -  Out: 10 [Blood:10]  BLOOD ADMINISTERED:none  DRAINS: none   LOCAL MEDICATIONS USED:  NONE  SPECIMEN:  Source of Specimen:  Left renal / ureteral stone fragments  DISPOSITION OF SPECIMEN:  [facility] for compositional analysis  COUNTS:  YES  TOURNIQUET:  * No tourniquets in log *  DICTATION: .Other Dictation: Dictation Number [id number]  PLAN OF CARE: Discharge to home after PACU  PATIENT DISPOSITION:  PACU - hemodynamically stable.   Delay start of Pharmacological VTE agent (>24hrs) due to surgical blood loss or risk of bleeding: yes

## 2016-08-15 NOTE — Transfer of Care (Signed)
Immediate Anesthesia Transfer of Care Note  Patient: Madison Gill  Procedure(s) Performed: Procedure(s): CYSTOSCOPY WITH RETROGRADE PYELOGRAM, URETEROSCOPY AND STENT REPLACEMENT (Left) HOLMIUM LASER APPLICATION (Left)  Patient Location: PACU  Anesthesia Type:General  Level of Consciousness: awake, alert  and oriented  Airway & Oxygen Therapy: Patient Spontanous Breathing and Patient connected to nasal cannula oxygen  Post-op Assessment: Report given to RN  Post vital signs: Reviewed and stable  Last Vitals: 107/79, 75, 10, 97% Vitals:   08/15/16 1134 08/15/16 1353  BP: 125/77 107/79  Pulse: 80 74  Resp: 16 12  Temp: 36.4 C 36.8 C    Last Pain:  Vitals:   08/15/16 1210  TempSrc:   PainSc: 4       Patients Stated Pain Goal: 8 (63/84/66 5993)  Complications: No apparent anesthesia complications

## 2016-08-15 NOTE — H&P (Signed)
Madison Gill is an 61 y.o. female.    Chief Complaint: Pre-op LEFT Ureteroscopic stone manipulation  HPI:   1 - Recurrent Nephrolithiasis - long h/o kidney stones previously treated with stent / SWL, medical therapy   09/2014 - Left URS to stone free on left, had 13mm Ureteral and several small intrarenal stones in functionally solitary kidney.  07/2016 - Left 72mm ureteral + scattered small intra-renal stones (23mm total) by CT on eval left flank pain ==> Left JJ stent placed.    2 - Right Atrophic Kidney - nearly completely atrophic right kidney by imaging x several with left sided compensatory hypertrophy. Cr normal 2016 (0.9).    PMH sig for DM2. Her PCP is Juanita Craver MD   Today "Madison Gill" is seen to proceed with LEFT ureteroscopic stone manipulation with goal of stone free. Most recent UCX negative. NO interval fevers.     Past Medical History:  Diagnosis Date  . Atrophy of right kidney    NON-FUNCTIONING  . Bilateral renal cysts    NON-COMPLEX  -- LEFT > RIGHT  . CKD (chronic kidney disease), stage II   . History of ectopic pregnancy 1988  . History of kidney stones   . Hypertension   . Hypertriglyceridemia   . Left ureteral stone   . Non-functioning kidney    RIGHT SIDE --  ATROPHY  . Renal calculus, left   . Type 2 diabetes mellitus ([place])   . Urgency of urination     Past Surgical History:  Procedure Laterality Date  . CARDIAC CATHETERIZATION  02/04/2006   dr Sande Brothers   normal coronary arteries and LVF (false positive stress test)  . CARDIOVASCULAR STRESS TEST  02/02/2006   possible reversible anterior wall ischemia versus breast attenuation (these changes were not seen on previous study 01-20-2005),  normal LV function and wall motion, ef 78%  . CESAREAN SECTION  1989  . CYSTO/  LEFT RETROGRADE PYELOGRAM/  URETEROSCOPY LASER LITHOTRIPSY STONE EXTRACTION AND STENT PLACEMENT  04/21/2005  . CYSTO/  URETEROSCOPE STONE EXTRACTION/ STENT PLACEMENT Left 05/23/2005  .  CYSTOSCOPY W/ URETERAL STENT PLACEMENT Left 07/21/2016   Procedure: CYSTOSCOPY WITH RETROGRADE PYELOGRAM/URETERAL STENT PLACEMENT;  Surgeon: Nickie Retort, MD;  Location: [facility];  Service: Urology;  Laterality: Left;  . CYSTOSCOPY WITH RETROGRADE PYELOGRAM, URETEROSCOPY AND STENT PLACEMENT Left 10/13/2014   Procedure: CYSTOSCOPY WITH LEFT RETROGRADE PYELOGRAM, URETEROSCOPY AND left STENt change ;  Surgeon: Alexis Frock, MD;  Location: [facility];  Service: Urology;  Laterality: Left;  . CYSTOSCOPY/URETEROSCOPY/HOLMIUM LASER/STENT PLACEMENT Left 09/12/2014   Procedure: CYSTOSCOPY/LEFT RETROGRADE PYELOGRAM/LEFT URETERAL STENT PLACEMENT;  Surgeon: Carolan Clines, MD;  Location: [facility];  Service: Urology;  Laterality: Left;  . ECTOPIC PREGNANCY SURGERY  1988  . EXTRACORPOREAL SHOCK WAVE LITHOTRIPSY  05/19/2005  . HOLMIUM LASER APPLICATION Left 0/34/7425   Procedure: HOLMIUM LASER APPLICATION;  Surgeon: Alexis Frock, MD;  Location: [facility];  Service: Urology;  Laterality: Left;  . HYSTEROSCOPY W/ ENDOMETRIAL ABLATION  11/25/2006   W/  NOVASURE  . LAPAROSCOPIC CHOLECYSTECTOMY  01/04/2001  . SHOULDER ARTHROSCOPY W/ SUBACROMIAL DECOMPRESSION AND DISTAL CLAVICLE EXCISION Left 03/29/2002   w/ Debridement    Family History  Problem Relation Age of Onset  . Diabetes Mother   . Hypertension Mother   . Diabetes Father   . Hypertension Father    Social History:  reports that she has never smoked. She has never used smokeless tobacco. She reports that she does not drink alcohol or use  drugs.  Allergies:  Allergies  Allergen Reactions  . Codeine Nausea And Vomiting  . Crestor [Rosuvastatin Calcium]     myalgias  . Lipitor [Atorvastatin Calcium]     myalgias  . Ramipril     cough  . Toprol Xl [Metoprolol Succinate]     fatigue    No prescriptions prior to admission.    No results found for this or any previous visit (from the past 48 hour(s)). No results found.  Review of Systems   Constitutional: Negative.  Negative for chills and fever.  HENT: Negative.   Eyes: Negative.   Respiratory: Negative.   Cardiovascular: Negative.   Gastrointestinal: Negative.   Genitourinary: Positive for urgency.  Musculoskeletal: Negative.   Skin: Negative.   Neurological: Negative.   Endo/Heme/Allergies: Negative.   Psychiatric/Behavioral: Negative.     Height 5' 4.5" (1.638 m), weight 68.9 kg (152 lb). Physical Exam  Constitutional: She is oriented to person, place, and time. She appears well-developed.  HENT:  Head: Normocephalic.  Eyes: Pupils are equal, round, and reactive to light.  Neck: Normal range of motion.  Cardiovascular: Normal rate.   Respiratory: Effort normal.  GI: Soft.  Genitourinary:  Genitourinary Comments: NO CVAT at present  Musculoskeletal: Normal range of motion.  Neurological: She is alert and oriented to person, place, and time.  Skin: Skin is warm.  Psychiatric: She has a normal mood and affect. Her behavior is normal. Thought content normal.     Assessment/Plan  Proceed as planned with LEFT ureteroscopic stone manipulation with goal of stone free given ipsilateral functionally solitary kidney. Risks, benefits, alternatives, expected peri-op course discussed previously and reiterated today.   Alexis Frock, MD 08/15/2016, 7:10 AM

## 2016-08-15 NOTE — Discharge Instructions (Signed)
1 - You may have urinary urgency (bladder spasms) and bloody urine on / off with stent in place. This is normal.  2 - Remove tethered stent on Monday morning by pulling on string, then blue-white plastic tubing, and discarding. Office is open Monday if any acute issues arise.   3 - Call MD or go to ER for fever >102, severe pain / nausea / vomiting not relieved by medications, or acute change in medical status   Post Anesthesia Home Care Instructions  Activity: Get plenty of rest for the remainder of the day. A responsible individual must stay with you for 24 hours following the procedure.  For the next 24 hours, DO NOT: -Drive a car -Paediatric nurse -Drink alcoholic beverages -Take any medication unless instructed by your physician -Make any legal decisions or sign important papers.  Meals: Start with liquid foods such as gelatin or soup. Progress to regular foods as tolerated. Avoid greasy, spicy, heavy foods. If nausea and/or vomiting occur, drink only clear liquids until the nausea and/or vomiting subsides. Call your physician if vomiting continues.  Special Instructions/Symptoms: Your throat may feel dry or sore from the anesthesia or the breathing tube placed in your throat during surgery. If this causes discomfort, gargle with warm salt water. The discomfort should disappear within 24 hours.  If you had a scopolamine patch placed behind your ear for the management of post- operative nausea and/or vomiting:  1. The medication in the patch is effective for 72 hours, after which it should be removed.  Wrap patch in a tissue and discard in the trash. Wash hands thoroughly with soap and water. 2. You may remove the patch earlier than 72 hours if you experience unpleasant side effects which may include dry mouth, dizziness or visual disturbances. 3. Avoid touching the patch. Wash your hands with soap and water after contact with the patch.   [facility] [phone] Post Ureteroscopy With or Without Stent Instructions  Definitions:  Ureter: The duct that transports urine from the kidney to the bladder. Stent:   A plastic hollow tube that is placed into the ureter, from the kidney to the bladder to prevent the ureter from swelling shut.  GENERAL INSTRUCTIONS:  Despite the fact that no skin incisions were used, the area around the ureter and bladder is raw and irritated. The stent is a foreign body which will further irritate the bladder wall. This irritation is manifested by increased frequency of urination, both day and night, and by an increase in the urge to urinate. In some, the urge to urinate is present almost always. Sometimes the urge is strong enough that you may not be able to stop yourself from urinating. The only real cure is to remove the stent and then give time for the bladder wall to heal which can't be done until the danger of the ureter swelling shut has passed, which varies.  You may see some blood in your urine while the stent is in place and a few days afterwards. Do not be alarmed, even if the urine was clear for a while. Get off your feet and drink lots of fluids until clearing occurs. If you start to pass clots or don't improve, call us.  DIET: You may return to your normal diet immediately. Because of the raw surface of your bladder, alcohol, spicy foods, acid type foods and drinks with caffeine may cause irritation or frequency and should be used in moderation. To keep your urine flowing freely  and to avoid constipation, drink plenty of fluids during the day ( 8-10 glasses ). Tip: Avoid cranberry juice because it is very acidic.  ACTIVITY: Your physical activity doesn't need to be restricted. However, if you are very active, you may see some blood in your urine. We suggest that you reduce your activity under these circumstances until the bleeding has stopped.  BOWELS: It is important to keep your bowels  regular during the postoperative period. Straining with bowel movements can cause bleeding. A bowel movement every other day is reasonable. Use a mild laxative if needed, such as Milk of Magnesia 2-3 tablespoons, or 2 Dulcolax tablets. Call if you continue to have problems. If you have been taking narcotics for pain, before, during or after your surgery, you may be constipated. Take a laxative if necessary.   MEDICATION: You should resume your pre-surgery medications unless told not to. In addition you will often be given an antibiotic to prevent infection. These should be taken as prescribed until the bottles are finished unless you are having an unusual reaction to one of the drugs.  PROBLEMS YOU SHOULD REPORT TO [place]:  Fevers over 100.5 Fahrenheit.  Heavy bleeding, or clots ( See above notes about blood in urine ).  Inability to urinate.  Drug reactions ( hives, rash, nausea, vomiting, diarrhea ).  Severe burning or pain with urination that is not improving.  FOLLOW-UP: You will need a follow-up appointment to monitor your progress. Call for this appointment at the number listed above. Usually the first appointment will be about three to fourteen days after your surgery.

## 2016-08-15 NOTE — Anesthesia Postprocedure Evaluation (Signed)
Anesthesia Post Note  Patient: [name]  Procedure(s) Performed: Procedure(s) (LRB): CYSTOSCOPY WITH RETROGRADE PYELOGRAM, URETEROSCOPY AND STENT REPLACEMENT (Left) HOLMIUM LASER APPLICATION (Left)  Patient location during evaluation: PACU Anesthesia Type: General Level of consciousness: awake and alert Pain management: pain level controlled Vital Signs Assessment: post-procedure vital signs reviewed and stable Respiratory status: spontaneous breathing, nonlabored ventilation, respiratory function stable and patient connected to nasal cannula oxygen Cardiovascular status: blood pressure returned to baseline and stable Postop Assessment: no signs of nausea or vomiting Anesthetic complications: no       Last Vitals:  Vitals:   08/15/16 1415 08/15/16 1430  BP: 110/75 111/74  Pulse: 75 76  Resp: 10 12  Temp:      Last Pain:  Vitals:   08/15/16 1430  TempSrc:   PainSc: 4                  [name]

## 2016-08-18 ENCOUNTER — Encounter (HOSPITAL_BASED_OUTPATIENT_CLINIC_OR_DEPARTMENT_OTHER): Payer: Self-pay | Admitting: Urology

## 2016-08-18 DIAGNOSIS — N200 Calculus of kidney: Secondary | ICD-10-CM | POA: Diagnosis not present

## 2016-08-18 NOTE — Op Note (Signed)
NAME:  Gill, Madison                       ACCOUNT NO.:  MEDICAL RECORD NO.:  [id number]  LOCATION:                                 FACILITY:  PHYSICIAN:  Alexis Frock, MD     DATE OF BIRTH:  [birth year]  DATE OF PROCEDURE: 08/15/2016                              OPERATIVE REPORT   DIAGNOSIS:  Recurrent left nephrolithiasis in functionally solitary kidney.  PROCEDURES: 1. Cystoscopy with left retrograde pyelogram and interpretation. 2. Exchange of left ureteral stent, 5 x 22 Polaris with tether. 3. Left ureteroscopy with laser lithotripsy.  ESTIMATED BLOOD LOSS:  Nil.  COMPLICATION:  None.  SPECIMEN:  Left renal and ureteral stone fragments for compositional analysis.  FINDINGS: 1. Unremarkable bladder. 2. Left retrograde pyelogram with mass effect from large upper pole     cyst. 3. Multifocal left intrarenal stones mostly papillary tip. 4. Complete resolution of all stone fragments larger than 1/3rd mm     following laser lithotripsy and basket extraction. 5. Successful replacement of left ureteral stent, proximal in the     renal pelvis and distal in the urinary bladder.  INDICATION:  Madison Gill is a pleasant 62 year old lady, who has a history of recurrent nephrolithiasis as well as significant right renal atrophy. She is on workup of a colicky left flank pain to have a left ureteral stone and her functionally solitary kidney.  She was temporized with stenting on late last month.  Options were discussed for definitive management including recommended path of ureteroscopy with goal of ipsilateral stone free and she wished to proceed.  Informed consent was obtained and placed in the medical record.  PROCEDURE IN DETAIL:  The patient being Madison Gill, was verified. Procedure being left ureteroscopic stone manipulation was confirmed. Procedure was carried out.  Time-out was performed.  Intravenous antibiotics were administered.  General anesthesia introduced.  The patient  was placed into a low lithotomy position and sterile field was created by prepping and draping the patient's vagina, introitus and proximal thighs using iodine.  Next, cystourethroscopy was performed using a rigid cystoscope with offset lens.  Inspection of the urinary bladder revealed no diverticula, calcifications, papillary lesions.  The distal end of left ureteral stent was seen in situ, was grasped, brought to the level of the urethral meatus, through which, a 0.038 Zip wire was advanced to the level of the upper pole and the stent was exchanged for an open-ended catheter and left retrograde pyelogram was obtained.  Left retrograde pyelogram demonstrated a single left ureter with single- system left kidney.  There was some mass effect from large upper pole cyst as known without intrarenal hydronephrosis.  The Zip wire was once again advanced, set aside as a safety wire.  An 8-French feeding tube placed in the urinary bladder for pressure release.  Next, semi-rigid ureteroscopy was performed at the entire length of left ureter alongside a separate Sensor working wire.  There was a midureteral stone in place without any evidence of perforation from prior stent that appeared to be much too large for simple basketing.  As such, holmium laser energy applied to the stone using settings of 0.3  joules and 30 Hz, fragmenting in three smaller pieces, which were then sequentially grasped on their long axis, removed and set aside for compositional analysis as the goal was to achieve ipsilateral stone free.  Semi-rigid ureteroscopy was performed to the remainder length of the left ureter.  No mucosal abnormalities were found and the semi-rigid scope was exchanged for 12/14, 24-cm ureteral access sheath at the level of the proximal ureter and systematic inspection was performed of the left kidney including all calices x3.  There were multifocal papillary-tip calcifications mostly in upper pole and  lower pole as expected.  Holmium laser energy applied to these calcifications unroofing several of them, allowing them to free- floating, which were then grasped with an Escape basket and removed, set aside for compositional analysis.  Following these maneuvers, there was excellent hemostasis, no evidence of renal perforation, there was complete resolution of all stone fragments larger than 1/3rd mm within the kidney and bladder.  Access sheath was removed under continuous vision.  No mucosal abnormalities were found.  Given access sheath usage and functionally solitary kidney, it was felt that brief interval stenting would be warranted.  As such, a new 5 x 22 Polaris-type stent was placed over the remaining safety wire using fluoroscopic guidance. Good proximal and distal deployment were noted.  This was fashioned to the inner thigh and procedure was terminated.  The patient tolerated the procedure well.  There were no immediate periprocedural complications. The patient was taken to the postanesthesia care unit in stable condition.          ______________________________ Alexis Frock, MD     TM/MEDQ  D:  08/15/2016  T:  08/15/2016  Job:  [id number]

## 2016-08-25 MED FILL — metFORMIN HCL 500 MG TABS: 500 | 90 days supply | Qty: 360 | Fill #0

## 2016-08-27 MED FILL — UNIFINE PENTIPS 31GX3/16": 31G X 5 MM | 90 days supply | Qty: 100 | Fill #0

## 2016-08-27 MED FILL — UNIFINE PENTIPS 31GX3/16: 31G X 5 MM | 90 days supply | Qty: 100 | Fill #0

## 2016-08-27 MED FILL — VICTOZA 18 MG/3 ML INJECT P: 18 | 45 days supply | Qty: 9 | Fill #0

## 2016-09-08 MED FILL — FENOFIBRATE 200 MG CAPSULE: 200 | 90 days supply | Qty: 90 | Fill #1

## 2016-09-08 MED FILL — LOSARTAN POTASSIUM 50 MG TA: 50 | 90 days supply | Qty: 90 | Fill #1

## 2016-09-08 MED FILL — INDAPAMIDE 2.5 MG TAB: 2.5 | 90 days supply | Qty: 90 | Fill #0

## 2016-09-08 MED FILL — ACCU-CHEK GUIDE TEST STRIP: 90 days supply | Qty: 200 | Fill #1

## 2016-09-08 MED FILL — CARVEDILOL 25 MG TABLET: 25 | 90 days supply | Qty: 90 | Fill #1

## 2016-09-08 MED FILL — ALPRAZolam 0.5 MG TABS: 0.5 | 90 days supply | Qty: 90 | Fill #1

## 2016-09-17 DIAGNOSIS — L255 Unspecified contact dermatitis due to plants, except food: Secondary | ICD-10-CM | POA: Diagnosis not present

## 2016-09-17 MED FILL — NEOMYCIN/POLY/HC EYE DROPS: 3.5-10000-1 | 10 days supply | Qty: 8 | Fill #0

## 2016-09-17 MED FILL — predniSONE 10 MG TABS: 10 | 9 days supply | Qty: 18 | Fill #0

## 2016-10-06 MED FILL — VICTOZA 18 MG/3 ML INJECT P: 18 | 45 days supply | Qty: 9 | Fill #1

## 2016-10-15 DIAGNOSIS — E119 Type 2 diabetes mellitus without complications: Secondary | ICD-10-CM | POA: Diagnosis not present

## 2016-10-15 DIAGNOSIS — H5213 Myopia, bilateral: Secondary | ICD-10-CM | POA: Diagnosis not present

## 2016-10-15 DIAGNOSIS — H524 Presbyopia: Secondary | ICD-10-CM | POA: Diagnosis not present

## 2016-10-15 DIAGNOSIS — H52223 Regular astigmatism, bilateral: Secondary | ICD-10-CM | POA: Diagnosis not present

## 2016-10-15 DIAGNOSIS — H59813 Chorioretinal scars after surgery for detachment, bilateral: Secondary | ICD-10-CM | POA: Diagnosis not present

## 2016-10-20 MED FILL — TRIAMCINOLONE 0.5% OINTMENT: 0.5 | 10 days supply | Qty: 15 | Fill #0

## 2016-10-27 MED FILL — INVOKANA 300 MG TABLET: 300 | 90 days supply | Qty: 90 | Fill #1

## 2016-11-04 DIAGNOSIS — Z1231 Encounter for screening mammogram for malignant neoplasm of breast: Secondary | ICD-10-CM | POA: Diagnosis not present

## 2016-11-04 DIAGNOSIS — Z01419 Encounter for gynecological examination (general) (routine) without abnormal findings: Secondary | ICD-10-CM | POA: Diagnosis not present

## 2016-11-04 MED FILL — CLOTRIMAZOLE-BETAMETHASONE: 1-0.05 | 14 days supply | Qty: 45 | Fill #0

## 2016-11-04 MED FILL — FLUCONAZOLE 150 MG TABLET: 150 | 3 days supply | Qty: 2 | Fill #0

## 2016-11-11 DIAGNOSIS — N261 Atrophy of kidney (terminal): Secondary | ICD-10-CM | POA: Diagnosis not present

## 2016-11-11 DIAGNOSIS — N200 Calculus of kidney: Secondary | ICD-10-CM | POA: Diagnosis not present

## 2016-11-12 DIAGNOSIS — E785 Hyperlipidemia, unspecified: Secondary | ICD-10-CM | POA: Diagnosis not present

## 2016-11-12 DIAGNOSIS — E119 Type 2 diabetes mellitus without complications: Secondary | ICD-10-CM | POA: Diagnosis not present

## 2016-11-12 DIAGNOSIS — I10 Essential (primary) hypertension: Secondary | ICD-10-CM | POA: Diagnosis not present

## 2016-11-12 MED FILL — TRIAMCINOLONE 0.5% OINTMENT: 0.5 | 10 days supply | Qty: 15 | Fill #1

## 2016-11-12 MED FILL — VICTOZA 18 MG/3 ML INJECT P: 18 | 29 days supply | Qty: 6 | Fill #2

## 2016-11-24 DIAGNOSIS — N200 Calculus of kidney: Secondary | ICD-10-CM | POA: Diagnosis not present

## 2016-11-24 DIAGNOSIS — H1131 Conjunctival hemorrhage, right eye: Secondary | ICD-10-CM | POA: Diagnosis not present

## 2016-12-01 MED FILL — UNIFINE PENTIPS 31GX3/16": 31G X 5 MM | 90 days supply | Qty: 100 | Fill #0

## 2016-12-01 MED FILL — UNIFINE PENTIPS 31GX3/16: 31G X 5 MM | 90 days supply | Qty: 100 | Fill #0

## 2016-12-07 MED FILL — metFORMIN HCL 500 MG TABS: 500 | 90 days supply | Qty: 360 | Fill #1

## 2016-12-08 MED FILL — FENOFIBRATE 200 MG CAPSULE: 200 | 90 days supply | Qty: 90 | Fill #0

## 2016-12-08 MED FILL — LOSARTAN POTASSIUM 50 MG TA: 50 | 90 days supply | Qty: 90 | Fill #0

## 2016-12-08 MED FILL — INDAPAMIDE 2.5 MG TAB: 2.5 | 90 days supply | Qty: 180 | Fill #0

## 2016-12-08 MED FILL — CARVEDILOL 25 MG TABLET: 25 | 90 days supply | Qty: 90 | Fill #0

## 2016-12-08 MED FILL — VICTOZA 18 MG/3 ML INJECT P: 18 | 90 days supply | Qty: 18 | Fill #0

## 2016-12-10 MED FILL — ACCU-CHEK GUIDE TEST STRIP: 90 days supply | Qty: 200 | Fill #2

## 2016-12-30 DIAGNOSIS — N200 Calculus of kidney: Secondary | ICD-10-CM | POA: Diagnosis not present

## 2016-12-30 DIAGNOSIS — N270 Small kidney, unilateral: Secondary | ICD-10-CM | POA: Diagnosis not present

## 2016-12-30 DIAGNOSIS — N281 Cyst of kidney, acquired: Secondary | ICD-10-CM | POA: Diagnosis not present

## 2017-01-14 MED FILL — TRIAMCINOLONE 0.5% OINTMENT: 0.5 | 20 days supply | Qty: 15 | Fill #0

## 2017-01-21 DIAGNOSIS — E1165 Type 2 diabetes mellitus with hyperglycemia: Secondary | ICD-10-CM | POA: Diagnosis not present

## 2017-01-21 DIAGNOSIS — E782 Mixed hyperlipidemia: Secondary | ICD-10-CM | POA: Diagnosis not present

## 2017-02-03 MED FILL — TRIAMCINOLONE 0.5% OINTMENT: 0.5 | 20 days supply | Qty: 15 | Fill #1

## 2017-02-05 DIAGNOSIS — Z6826 Body mass index (BMI) 26.0-26.9, adult: Secondary | ICD-10-CM | POA: Diagnosis not present

## 2017-02-05 DIAGNOSIS — E782 Mixed hyperlipidemia: Secondary | ICD-10-CM | POA: Insufficient documentation

## 2017-02-05 DIAGNOSIS — Z7984 Long term (current) use of oral hypoglycemic drugs: Secondary | ICD-10-CM | POA: Diagnosis not present

## 2017-02-05 DIAGNOSIS — I10 Essential (primary) hypertension: Secondary | ICD-10-CM | POA: Diagnosis not present

## 2017-02-05 DIAGNOSIS — E1165 Type 2 diabetes mellitus with hyperglycemia: Secondary | ICD-10-CM | POA: Diagnosis not present

## 2017-02-05 DIAGNOSIS — E663 Overweight: Secondary | ICD-10-CM | POA: Diagnosis not present

## 2017-02-05 MED FILL — PRAVASTATIN NA 10 MG TAB: 10 | 90 days supply | Qty: 90 | Fill #0

## 2017-02-09 DIAGNOSIS — R1084 Generalized abdominal pain: Secondary | ICD-10-CM | POA: Diagnosis not present

## 2017-02-09 DIAGNOSIS — N200 Calculus of kidney: Secondary | ICD-10-CM | POA: Diagnosis not present

## 2017-02-09 MED FILL — DOXYCYCLINE HYC 100 MG TAB: 100 | 3 days supply | Qty: 6 | Fill #0

## 2017-02-09 MED FILL — HYDROCODON-APAP 5-325: 5-325 | 3 days supply | Qty: 20 | Fill #0

## 2017-02-09 MED FILL — ONDANSETRON HCL 4 MG TABLET: 4 | 7 days supply | Qty: 30 | Fill #0

## 2017-03-01 MED FILL — UNIFINE PENTIPS 31GX3/16": 31G X 5 MM | 90 days supply | Qty: 100 | Fill #1

## 2017-03-01 MED FILL — UNIFINE PENTIPS 31GX3/16: 31G X 5 MM | 90 days supply | Qty: 100 | Fill #1

## 2017-03-02 MED FILL — VICTOZA 18 MG/3 ML INJECT P: 18 | 90 days supply | Qty: 27 | Fill #0

## 2017-03-04 DIAGNOSIS — N200 Calculus of kidney: Secondary | ICD-10-CM | POA: Insufficient documentation

## 2017-03-04 DIAGNOSIS — E781 Pure hyperglyceridemia: Secondary | ICD-10-CM | POA: Diagnosis not present

## 2017-03-04 DIAGNOSIS — E119 Type 2 diabetes mellitus without complications: Secondary | ICD-10-CM | POA: Diagnosis not present

## 2017-03-04 DIAGNOSIS — F419 Anxiety disorder, unspecified: Secondary | ICD-10-CM | POA: Diagnosis not present

## 2017-03-04 DIAGNOSIS — E785 Hyperlipidemia, unspecified: Secondary | ICD-10-CM | POA: Diagnosis not present

## 2017-03-04 DIAGNOSIS — I10 Essential (primary) hypertension: Secondary | ICD-10-CM | POA: Diagnosis not present

## 2017-03-04 DIAGNOSIS — E782 Mixed hyperlipidemia: Secondary | ICD-10-CM | POA: Diagnosis not present

## 2017-03-04 MED FILL — FENOFIBRATE 200 MG CAPSULE: 200 | 90 days supply | Qty: 90 | Fill #0

## 2017-03-04 MED FILL — CARVEDILOL 25 MG TABS: 25 | 90 days supply | Qty: 90 | Fill #0

## 2017-03-04 MED FILL — LOSARTAN POTASSIUM 50 MG TA: 50 | 90 days supply | Qty: 90 | Fill #0

## 2017-03-04 MED FILL — metFORMIN HCL 500 MG TABS: 500 | 90 days supply | Qty: 360 | Fill #0

## 2017-03-04 MED FILL — ALPRAZolam 0.5 MG TABS: 0.5 | 90 days supply | Qty: 90 | Fill #0

## 2017-03-04 MED FILL — INVOKANA 300 MG TABLET: 300 | 90 days supply | Qty: 90 | Fill #0

## 2017-03-10 DIAGNOSIS — R102 Pelvic and perineal pain: Secondary | ICD-10-CM | POA: Diagnosis not present

## 2017-03-22 MED FILL — ACCU-CHEK GUIDE TEST STRIP: 90 days supply | Qty: 200 | Fill #3

## 2017-03-23 MED FILL — INDAPAMIDE 2.5 MG TAB: 2.5 | 90 days supply | Qty: 180 | Fill #1

## 2017-03-25 ENCOUNTER — Other Ambulatory Visit: Payer: Self-pay | Admitting: *Deleted

## 2017-03-25 NOTE — Patient Outreach (Signed)
Madison Gill transitioned from the [employer] [facility] program to the [employer] on 05/06/16 for Type II diabetes self-management assistance so will close case to the diabetes [facility] program due to delegation of disease management services to [employer] from [employer] for [place] members in 2019. Barrington Ellison RN,CCM,CDE [place] Management Coordinator [facility] and [employer] [phone] Office Fax [phone]

## 2017-05-08 DIAGNOSIS — E782 Mixed hyperlipidemia: Secondary | ICD-10-CM | POA: Diagnosis not present

## 2017-05-08 DIAGNOSIS — E1165 Type 2 diabetes mellitus with hyperglycemia: Secondary | ICD-10-CM | POA: Diagnosis not present

## 2017-05-10 MED FILL — PRAVASTATIN NA 10 MG TAB: 10 | 90 days supply | Qty: 90 | Fill #1

## 2017-05-14 DIAGNOSIS — E663 Overweight: Secondary | ICD-10-CM | POA: Diagnosis not present

## 2017-05-14 DIAGNOSIS — E782 Mixed hyperlipidemia: Secondary | ICD-10-CM | POA: Diagnosis not present

## 2017-05-14 DIAGNOSIS — Z6826 Body mass index (BMI) 26.0-26.9, adult: Secondary | ICD-10-CM | POA: Diagnosis not present

## 2017-05-14 DIAGNOSIS — I10 Essential (primary) hypertension: Secondary | ICD-10-CM | POA: Diagnosis not present

## 2017-05-14 DIAGNOSIS — E1165 Type 2 diabetes mellitus with hyperglycemia: Secondary | ICD-10-CM | POA: Diagnosis not present

## 2017-05-31 MED FILL — UNIFINE PENTIPS 31GX3/16": 31G X 5 MM | 90 days supply | Qty: 100 | Fill #2

## 2017-05-31 MED FILL — FENOFIBRATE 200 MG CAPSULE: 200 | 90 days supply | Qty: 90 | Fill #1

## 2017-05-31 MED FILL — UNIFINE PENTIPS 31GX3/16: 31G X 5 MM | 90 days supply | Qty: 100 | Fill #2

## 2017-05-31 MED FILL — LOSARTAN POTASSIUM 50 MG TA: 50 | 90 days supply | Qty: 90 | Fill #1

## 2017-05-31 MED FILL — ALPRAZolam 0.5 MG TABS: 0.5 | 90 days supply | Qty: 90 | Fill #1

## 2017-05-31 MED FILL — VICTOZA 18 MG/3 ML INJECT P: 18 | 90 days supply | Qty: 27 | Fill #1

## 2017-05-31 MED FILL — CARVEDILOL 25 MG TABS: 25 | 90 days supply | Qty: 90 | Fill #1

## 2017-06-01 MED FILL — metFORMIN HCL 500 MG TABS: 500 | 90 days supply | Qty: 360 | Fill #1

## 2017-06-01 MED FILL — INVOKANA 300 MG TABLET: 300 | 90 days supply | Qty: 90 | Fill #1

## 2017-06-08 MED FILL — ACCU-CHEK GUIDE TEST STRIP: 50 days supply | Qty: 100 | Fill #0

## 2017-06-22 MED FILL — INDAPAMIDE 2.5 MG TAB: 2.5 | 90 days supply | Qty: 180 | Fill #2

## 2017-08-03 MED FILL — TRIAMCINOLONE 0.5% OINTMENT: 0.5 | 10 days supply | Qty: 15 | Fill #0

## 2017-08-06 DIAGNOSIS — E782 Mixed hyperlipidemia: Secondary | ICD-10-CM | POA: Diagnosis not present

## 2017-08-06 DIAGNOSIS — E1165 Type 2 diabetes mellitus with hyperglycemia: Secondary | ICD-10-CM | POA: Diagnosis not present

## 2017-08-13 DIAGNOSIS — Z6826 Body mass index (BMI) 26.0-26.9, adult: Secondary | ICD-10-CM | POA: Diagnosis not present

## 2017-08-13 DIAGNOSIS — I10 Essential (primary) hypertension: Secondary | ICD-10-CM | POA: Diagnosis not present

## 2017-08-13 DIAGNOSIS — Z7984 Long term (current) use of oral hypoglycemic drugs: Secondary | ICD-10-CM | POA: Diagnosis not present

## 2017-08-13 DIAGNOSIS — E1165 Type 2 diabetes mellitus with hyperglycemia: Secondary | ICD-10-CM | POA: Diagnosis not present

## 2017-08-13 DIAGNOSIS — E782 Mixed hyperlipidemia: Secondary | ICD-10-CM | POA: Diagnosis not present

## 2017-08-13 DIAGNOSIS — E663 Overweight: Secondary | ICD-10-CM | POA: Diagnosis not present

## 2017-08-13 MED FILL — PIOGLITAZONE HCL 15 MG TAB: 15 | 90 days supply | Qty: 90 | Fill #0

## 2017-08-23 MED FILL — UNIFINE PENTIPS 31GX3/16": 31G X 5 MM | 90 days supply | Qty: 100 | Fill #3

## 2017-08-23 MED FILL — UNIFINE PENTIPS 31GX3/16: 31G X 5 MM | 90 days supply | Qty: 100 | Fill #3

## 2017-08-23 MED FILL — VICTOZA 18 MG/3 ML INJECT P: 18 | 90 days supply | Qty: 27 | Fill #2

## 2017-08-23 MED FILL — ACCU-CHEK GUIDE TEST STRIP: 75 days supply | Qty: 150 | Fill #1

## 2017-08-24 MED FILL — LOSARTAN POTASSIUM 50 MG TA: 50 | 90 days supply | Qty: 90 | Fill #2

## 2017-08-24 MED FILL — INVOKANA 300 MG TABLET: 300 | 90 days supply | Qty: 90 | Fill #2

## 2017-08-24 MED FILL — PRAVASTATIN SODIUM 10 MG TA: 10 | 90 days supply | Qty: 90 | Fill #2

## 2017-08-24 MED FILL — metFORMIN HCL 500 MG TABS: 500 | 90 days supply | Qty: 360 | Fill #0

## 2017-09-02 DIAGNOSIS — E782 Mixed hyperlipidemia: Secondary | ICD-10-CM | POA: Diagnosis not present

## 2017-09-02 DIAGNOSIS — I10 Essential (primary) hypertension: Secondary | ICD-10-CM | POA: Diagnosis not present

## 2017-09-02 DIAGNOSIS — L819 Disorder of pigmentation, unspecified: Secondary | ICD-10-CM | POA: Diagnosis not present

## 2017-09-02 DIAGNOSIS — E119 Type 2 diabetes mellitus without complications: Secondary | ICD-10-CM | POA: Diagnosis not present

## 2017-09-02 DIAGNOSIS — F419 Anxiety disorder, unspecified: Secondary | ICD-10-CM | POA: Diagnosis not present

## 2017-09-02 DIAGNOSIS — F418 Other specified anxiety disorders: Secondary | ICD-10-CM | POA: Diagnosis not present

## 2017-09-02 MED FILL — ALPRAZolam 0.5 MG TABS: 0.5 | 90 days supply | Qty: 90 | Fill #0

## 2017-09-06 MED FILL — TRIAMCINOLONE 0.5% OINTMENT: 0.5 | 10 days supply | Qty: 15 | Fill #1

## 2017-09-23 DIAGNOSIS — D224 Melanocytic nevi of scalp and neck: Secondary | ICD-10-CM | POA: Diagnosis not present

## 2017-09-23 DIAGNOSIS — L570 Actinic keratosis: Secondary | ICD-10-CM | POA: Diagnosis not present

## 2017-09-23 DIAGNOSIS — L821 Other seborrheic keratosis: Secondary | ICD-10-CM | POA: Diagnosis not present

## 2017-09-23 DIAGNOSIS — L814 Other melanin hyperpigmentation: Secondary | ICD-10-CM | POA: Diagnosis not present

## 2017-09-23 DIAGNOSIS — D1724 Benign lipomatous neoplasm of skin and subcutaneous tissue of left leg: Secondary | ICD-10-CM | POA: Diagnosis not present

## 2017-09-23 DIAGNOSIS — D1801 Hemangioma of skin and subcutaneous tissue: Secondary | ICD-10-CM | POA: Diagnosis not present

## 2017-09-23 DIAGNOSIS — D2261 Melanocytic nevi of right upper limb, including shoulder: Secondary | ICD-10-CM | POA: Diagnosis not present

## 2017-09-23 DIAGNOSIS — D225 Melanocytic nevi of trunk: Secondary | ICD-10-CM | POA: Diagnosis not present

## 2017-09-23 DIAGNOSIS — B351 Tinea unguium: Secondary | ICD-10-CM | POA: Diagnosis not present

## 2017-10-05 MED FILL — TRIAMCINOLONE 0.1% OINTMENT: 0.1 | 20 days supply | Qty: 80 | Fill #0

## 2017-10-23 DIAGNOSIS — E119 Type 2 diabetes mellitus without complications: Secondary | ICD-10-CM | POA: Diagnosis not present

## 2017-10-23 DIAGNOSIS — H52223 Regular astigmatism, bilateral: Secondary | ICD-10-CM | POA: Diagnosis not present

## 2017-10-23 DIAGNOSIS — H524 Presbyopia: Secondary | ICD-10-CM | POA: Diagnosis not present

## 2017-10-23 DIAGNOSIS — H59813 Chorioretinal scars after surgery for detachment, bilateral: Secondary | ICD-10-CM | POA: Diagnosis not present

## 2017-10-23 DIAGNOSIS — H5211 Myopia, right eye: Secondary | ICD-10-CM | POA: Diagnosis not present

## 2017-10-23 DIAGNOSIS — H33323 Round hole, bilateral: Secondary | ICD-10-CM | POA: Diagnosis not present

## 2017-10-23 DIAGNOSIS — H5202 Hypermetropia, left eye: Secondary | ICD-10-CM | POA: Diagnosis not present

## 2017-10-23 DIAGNOSIS — H25013 Cortical age-related cataract, bilateral: Secondary | ICD-10-CM | POA: Diagnosis not present

## 2017-11-01 MED FILL — TRIAMCINOLONE 0.1% OINTMENT: 0.1 | 20 days supply | Qty: 80 | Fill #1

## 2017-11-05 DIAGNOSIS — E782 Mixed hyperlipidemia: Secondary | ICD-10-CM | POA: Diagnosis not present

## 2017-11-05 DIAGNOSIS — E1165 Type 2 diabetes mellitus with hyperglycemia: Secondary | ICD-10-CM | POA: Diagnosis not present

## 2017-11-06 MED FILL — PIOGLITAZONE HCL 15 MG TAB: 15 | 90 days supply | Qty: 90 | Fill #1

## 2017-11-08 MED FILL — ACCU-CHEK GUIDE TEST STRIP: 75 days supply | Qty: 150 | Fill #2

## 2017-11-12 DIAGNOSIS — E663 Overweight: Secondary | ICD-10-CM | POA: Diagnosis not present

## 2017-11-12 DIAGNOSIS — I10 Essential (primary) hypertension: Secondary | ICD-10-CM | POA: Diagnosis not present

## 2017-11-12 DIAGNOSIS — E1165 Type 2 diabetes mellitus with hyperglycemia: Secondary | ICD-10-CM | POA: Diagnosis not present

## 2017-11-12 DIAGNOSIS — Z7984 Long term (current) use of oral hypoglycemic drugs: Secondary | ICD-10-CM | POA: Diagnosis not present

## 2017-11-12 DIAGNOSIS — Z6827 Body mass index (BMI) 27.0-27.9, adult: Secondary | ICD-10-CM | POA: Diagnosis not present

## 2017-11-12 DIAGNOSIS — E782 Mixed hyperlipidemia: Secondary | ICD-10-CM | POA: Diagnosis not present

## 2017-11-18 DIAGNOSIS — Z124 Encounter for screening for malignant neoplasm of cervix: Secondary | ICD-10-CM | POA: Diagnosis not present

## 2017-11-18 DIAGNOSIS — Z1231 Encounter for screening mammogram for malignant neoplasm of breast: Secondary | ICD-10-CM | POA: Diagnosis not present

## 2017-11-18 DIAGNOSIS — Z01419 Encounter for gynecological examination (general) (routine) without abnormal findings: Secondary | ICD-10-CM | POA: Diagnosis not present

## 2017-11-23 MED FILL — VICTOZA 18 MG/3 ML INJECT P: 18 | 90 days supply | Qty: 27 | Fill #3

## 2017-11-23 MED FILL — LOSARTAN POTASSIUM 50 MG TA: 50 | 90 days supply | Qty: 90 | Fill #3

## 2017-11-23 MED FILL — PRAVASTATIN SODIUM 10 MG TA: 10 | 90 days supply | Qty: 90 | Fill #3

## 2017-11-23 MED FILL — UNIFINE PENTIPS 31GX3/16": 31G X 5 MM | 90 days supply | Qty: 100 | Fill #0

## 2017-11-23 MED FILL — metFORMIN HCL 500 MG TABS: 500 | 90 days supply | Qty: 360 | Fill #1

## 2017-11-23 MED FILL — INVOKANA 300 MG TABLET: 300 | 90 days supply | Qty: 90 | Fill #3

## 2017-11-23 MED FILL — UNIFINE PENTIPS 31GX3/16: 31G X 5 MM | 90 days supply | Qty: 100 | Fill #0

## 2017-12-14 MED FILL — ALPRAZolam 0.5 MG TABS: 0.5 | 90 days supply | Qty: 90 | Fill #1

## 2017-12-23 DIAGNOSIS — R197 Diarrhea, unspecified: Secondary | ICD-10-CM | POA: Diagnosis not present

## 2017-12-24 MED FILL — POTASSIUM CHL ER M10 TABLET: 10 | 10 days supply | Qty: 10 | Fill #0

## 2017-12-28 DIAGNOSIS — R197 Diarrhea, unspecified: Secondary | ICD-10-CM | POA: Diagnosis not present

## 2018-01-06 DIAGNOSIS — K921 Melena: Secondary | ICD-10-CM | POA: Diagnosis not present

## 2018-01-06 DIAGNOSIS — K529 Noninfective gastroenteritis and colitis, unspecified: Secondary | ICD-10-CM | POA: Diagnosis not present

## 2018-01-25 DIAGNOSIS — Z8639 Personal history of other endocrine, nutritional and metabolic disease: Secondary | ICD-10-CM | POA: Diagnosis not present

## 2018-01-25 MED FILL — ACCU-CHEK GUIDE TEST STRIP: 75 days supply | Qty: 150 | Fill #3

## 2018-02-08 MED FILL — PIOGLITAZONE HCL 15 MG TAB: 15 | 90 days supply | Qty: 90 | Fill #2

## 2018-02-08 MED FILL — INDAPAMIDE 2.5 MG TAB: 2.5 | 90 days supply | Qty: 180 | Fill #0

## 2018-02-15 DIAGNOSIS — E1165 Type 2 diabetes mellitus with hyperglycemia: Secondary | ICD-10-CM | POA: Diagnosis not present

## 2018-02-15 DIAGNOSIS — E782 Mixed hyperlipidemia: Secondary | ICD-10-CM | POA: Diagnosis not present

## 2018-02-18 DIAGNOSIS — E538 Deficiency of other specified B group vitamins: Secondary | ICD-10-CM | POA: Insufficient documentation

## 2018-02-18 DIAGNOSIS — I10 Essential (primary) hypertension: Secondary | ICD-10-CM | POA: Diagnosis not present

## 2018-02-18 DIAGNOSIS — E782 Mixed hyperlipidemia: Secondary | ICD-10-CM | POA: Diagnosis not present

## 2018-02-18 DIAGNOSIS — E1165 Type 2 diabetes mellitus with hyperglycemia: Secondary | ICD-10-CM | POA: Diagnosis not present

## 2018-02-18 MED FILL — metFORMIN HCL ER 500 MG TB2: 500 | 90 days supply | Qty: 360 | Fill #0

## 2018-02-22 MED FILL — INVOKANA 300 MG TABLET: 300 | 90 days supply | Qty: 90 | Fill #0

## 2018-02-22 MED FILL — PRAVASTATIN SODIUM 10 MG TA: 10 | 90 days supply | Qty: 90 | Fill #0

## 2018-02-22 MED FILL — LOSARTAN POTASSIUM 50 MG TA: 50 | 30 days supply | Qty: 30 | Fill #0

## 2018-02-26 MED FILL — UNIFINE PENTIPS 31GX3/16: 31G X 5 MM | 90 days supply | Qty: 100 | Fill #1

## 2018-02-26 MED FILL — VICTOZA 18 MG/3 ML INJECT P: 18 | 90 days supply | Qty: 27 | Fill #0

## 2018-02-26 MED FILL — UNIFINE PENTIPS 31GX3/16": 31G X 5 MM | 90 days supply | Qty: 100 | Fill #1

## 2018-03-01 DIAGNOSIS — L03011 Cellulitis of right finger: Secondary | ICD-10-CM | POA: Diagnosis not present

## 2018-03-01 DIAGNOSIS — B351 Tinea unguium: Secondary | ICD-10-CM | POA: Diagnosis not present

## 2018-03-10 DIAGNOSIS — I10 Essential (primary) hypertension: Secondary | ICD-10-CM | POA: Diagnosis not present

## 2018-03-10 DIAGNOSIS — F419 Anxiety disorder, unspecified: Secondary | ICD-10-CM | POA: Diagnosis not present

## 2018-03-10 DIAGNOSIS — G4700 Insomnia, unspecified: Secondary | ICD-10-CM | POA: Diagnosis not present

## 2018-03-10 DIAGNOSIS — E785 Hyperlipidemia, unspecified: Secondary | ICD-10-CM | POA: Diagnosis not present

## 2018-03-11 ENCOUNTER — Ambulatory Visit: Payer: 59 | Admitting: Podiatry

## 2018-03-11 ENCOUNTER — Encounter: Payer: Self-pay | Admitting: Podiatry

## 2018-03-11 VITALS — BP 124/87

## 2018-03-11 DIAGNOSIS — B351 Tinea unguium: Secondary | ICD-10-CM | POA: Diagnosis not present

## 2018-03-11 DIAGNOSIS — S90212A Contusion of left great toe with damage to nail, initial encounter: Secondary | ICD-10-CM | POA: Diagnosis not present

## 2018-03-11 DIAGNOSIS — L603 Nail dystrophy: Secondary | ICD-10-CM | POA: Diagnosis not present

## 2018-03-11 DIAGNOSIS — L601 Onycholysis: Secondary | ICD-10-CM | POA: Diagnosis not present

## 2018-03-11 NOTE — Progress Notes (Signed)
Subjective: Madison Gill presents today with painful, thick toenails b/l great toes which have been present greater than a month and which interfere with daily activities.    Patient has secondary concern of painful left great toe. She states her 62 yr old [place] accidentally stepped on her toe and it has been painful eversince. Pain is aggravated when wearing enclosed shoe gear.   Current Outpatient Medications:  .  ACCU-CHEK GUIDE test strip, , Disp: , Rfl: 3 .  ALPRAZolam (XANAX) 0.5 MG tablet, Take 0.25 mg by mouth at bedtime as needed for sleep. , Disp: , Rfl: 1 .  aspirin EC 81 MG tablet, Take 81 mg by mouth every morning. , Disp: , Rfl:  .  azithromycin (ZITHROMAX) 250 MG tablet, Take by mouth daily., Disp: , Rfl:  .  benzonatate (TESSALON) 100 MG capsule, benzonatate 100 mg capsule  TAKE ONE CAPSULE BY MOUTH EVERY 8 HOURS AS NEEDED, Disp: , Rfl:  .  carvedilol (COREG) 25 MG tablet, Take 12.5 mg by mouth 2 (two) times daily with a meal. , Disp: , Rfl:  .  cephALEXin (KEFLEX) 500 MG capsule, Take 1 capsule (500 mg total) by mouth 2 (two) times daily. 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  clotrimazole-betamethasone (LOTRISONE) cream, Apply 1 application topically 2 (two) times daily., Disp: , Rfl: 0 .  Exenatide ER (BYDUREON) 2 MG SRER, Bydureon 2 mg subcutaneous extended release suspension, Disp: , Rfl:  .  fenofibrate micronized (LOFIBRA) 200 MG capsule, Take 200 mg by mouth daily before breakfast., Disp: , Rfl:  .  glimepiride (AMARYL) 1 MG tablet, glimepiride 1 mg tablet, Disp: , Rfl:  .  HYDROcodone-acetaminophen (NORCO) 5-325 MG tablet, Take 1-2 tablets by mouth every 4 (four) hours as needed for moderate pain or severe pain. Post-operatively, Disp: 20 tablet, Rfl: 0 .  HYDROcodone-homatropine (HYCODAN) 5-1.5 MG/5ML syrup, hydrocodone-homatropine 5 mg-1.5 mg/5 mL oral syrup, Disp: , Rfl:  .  ibuprofen (ADVIL,MOTRIN) 200 MG tablet,  Take 600 mg by mouth every 6 (six) hours as needed for headache or moderate pain (pain from yeast infection). , Disp: , Rfl:  .  indapamide (LOZOL) 2.5 MG tablet, Take 2.5 mg by mouth daily., Disp: , Rfl:  .  INVOKANA 300 MG TABS tablet, Take 300 mg by mouth daily before breakfast. , Disp: , Rfl: 1 .  liraglutide (VICTOZA) 18 MG/3ML SOPN, Victoza 3-Pak 0.6 mg/0.1 mL (18 mg/3 mL) subcutaneous pen injector, Disp: , Rfl:  .  losartan (COZAAR) 50 MG tablet, Take 50 mg by mouth every morning. , Disp: , Rfl: 1 .  losartan-hydrochlorothiazide (HYZAAR) 100-25 MG tablet, losartan 100 mg-hydrochlorothiazide 25 mg tablet, Disp: , Rfl:  .  meloxicam (MOBIC) 7.5 MG tablet, meloxicam 7.5 mg tablet  TK 1 T PO BID PRN P, Disp: , Rfl:  .  metFORMIN (GLUCOPHAGE) 500 MG tablet, Take 1,000 mg by mouth 2 (two) times daily with a meal. , Disp: , Rfl:  .  Omega-3 Fatty Acids (FISH OIL PO), Take 1 capsule by mouth daily., Disp: , Rfl:  .  pioglitazone (ACTOS) 15 MG tablet, pioglitazone 15 mg tablet, Disp: , Rfl:  .  polyethylene glycol-electrolytes (GAVILYTE-N WITH FLAVOR PACK) 420 g solution, GaviLyte-N 420 gram oral solution, Disp: , Rfl:  .  potassium chloride (K-DUR,KLOR-CON) 10 MEQ tablet, , Disp: , Rfl: 0 .  pravastatin (PRAVACHOL) 10 MG tablet, pravastatin 10 mg tablet, Disp: , Rfl:  .  senna-docusate (SENOKOT-S) 8.6-50 MG tablet, Take 1 tablet by mouth 2 (two) times daily. While taking strong pain meds to prevent constipation., Disp: 20 tablet, Rfl: 0 .  UNIFINE PENTIPS 31G X 5 MM MISC, , Disp: , Rfl: 3  Allergies  Allergen Reactions  . Codeine Nausea And Vomiting  . Crestor [Rosuvastatin Calcium]     myalgias  . Lipitor  [Atorvastatin Calcium]   . Lipitor [Atorvastatin Calcium]     myalgias  . Ramipril     cough  . Toprol Xl [Metoprolol Succinate]     fatigue    Objective:  Vascular Examination: Capillary refill time immediate x 10 digits Dorsalis pedis and Posterior tibial pulses palpable  b/l Digital hair present x 10 digits Skin temperature gradient WNL b/l  Dermatological Examination: Skin with normal turgor, texture and tone b/l  Left great toenail with subacute process with subungual hematoma with dorsal nailplate which is loosened with expressed serosanguinous drainage. Under loose nailplate, there is a visible new nailplate growing from proximal nail fold.  Right great toenail thick, discolored with subungual debris.   Musculoskeletal: Muscle strength 5/5 to all LE muscle groups  Neurological: Sensation intact with 10 gram monofilament. Vibratory sensation intact.  Assessment: 1. Contusion left great toe 2. Onychomycosis right great toe  Plan: 1. Discussed diagnoses and treatment options for onychomycosis. Nail clippings taken on today for fungal culture.  2. Loose nailplate left great toe gently debrided from all attachments without need for local anesthetic. Nailbed cleansed with wound cleanser. Triple antibiotic ointment and light dressing applied. Patient given written soaking instructions. 3. Patient to continue soft, supportive shoe gear 4. Patient to report any pedal injuries to medical professional immediately. 5. Will call patient with results. Patient/POA to call should there be a concern in the interim.

## 2018-03-11 NOTE — Patient Instructions (Addendum)
Diabetes and Foot Care Diabetes may cause you to have problems because of poor blood supply (circulation) to your feet and legs. This may cause the skin on your feet to become thinner, break easier, and heal more slowly. Your skin may become dry, and the skin may peel and crack. You may also have nerve damage in your legs and feet causing decreased feeling in them. You may not notice minor injuries to your feet that could lead to infections or more serious problems. Taking care of your feet is one of the most important things you can do for yourself. Follow these instructions at home:  Wear shoes at all times, even in the house. Do not go barefoot. Bare feet are easily injured.  Check your feet daily for blisters, cuts, and redness. If you cannot see the bottom of your feet, use a mirror or ask someone for help.  Wash your feet with warm water (do not use hot water) and mild soap. Then pat your feet and the areas between your toes until they are completely dry. Do not soak your feet as this can dry your skin.  Apply a moisturizing lotion or petroleum jelly (that does not contain alcohol and is unscented) to the skin on your feet and to dry, brittle toenails. Do not apply lotion between your toes.  Trim your toenails straight across. Do not dig under them or around the cuticle. File the edges of your nails with an emery board or nail file.  Do not cut corns or calluses or try to remove them with medicine.  Wear clean socks or stockings every day. Make sure they are not too tight. Do not wear knee-high stockings since they may decrease blood flow to your legs.  Wear shoes that fit properly and have enough cushioning. To break in new shoes, wear them for just a few hours a day. This prevents you from injuring your feet. Always look in your shoes before you put them on to be sure there are no objects inside.  Do not cross your legs. This may decrease the blood flow to your feet.  If you find a  minor scrape, cut, or break in the skin on your feet, keep it and the skin around it clean and dry. These areas may be cleansed with mild soap and water. Do not cleanse the area with peroxide, alcohol, or iodine.  When you remove an adhesive bandage, be sure not to damage the skin around it.  If you have a wound, look at it several times a day to make sure it is healing.  Do not use heating pads or hot water bottles. They may burn your skin. If you have lost feeling in your feet or legs, you may not know it is happening until it is too late.  Make sure your health care provider performs a complete foot exam at least annually or more often if you have foot problems. Report any cuts, sores, or bruises to your health care provider immediately. Contact a health care provider if:  You have an injury that is not healing.  You have cuts or breaks in the skin.  You have an ingrown nail.  You notice redness on your legs or feet.  You feel burning or tingling in your legs or feet.  You have pain or cramps in your legs and feet.  Your legs or feet are numb.  Your feet always feel cold. Get help right away if:  There is increasing   redness, swelling, or pain in or around a wound.  There is a red line that goes up your leg.  Pus is coming from a wound.  You develop a fever or as directed by your health care provider.  You notice a bad smell coming from an ulcer or wound. This information is not intended to replace advice given to you by your health care provider. Make sure you discuss any questions you have with your health care provider. Document Released: 04/04/2000 Document Revised: 09/13/2015 Document Reviewed: 09/14/2012 Elsevier Interactive Patient Education  2017 [place] Instructions    THE DAY AFTER THE PROCEDURE  Place 1/4 cup of epsom salts in a quart of warm tap water.  Submerge your foot or feet with outer bandage intact for the initial soak; this will allow  the bandage to become moist and wet for easy lift off.  Once you remove your bandage, continue to soak in the solution for 20 minutes.  This soak should be done twice a day.  Next, remove your foot or feet from solution, blot dry the affected area and cover.  You may use a band aid large enough to cover the area or use gauze and tape.  Apply other medications to the area as directed by the doctor such as polysporin neosporin.  IF YOUR SKIN BECOMES IRRITATED WHILE USING THESE INSTRUCTIONS, IT IS OKAY TO SWITCH TO  WHITE VINEGAR AND WATER. Or you may use antibacterial soap and water to keep the toe clean  Monitor for any signs/symptoms of infection. Call the office immediately if any occur or go directly to the emergency room. Call with any questions/concerns.   Onychomycosis/Fungal Toenails  WHAT IS IT? An infection that lies within the keratin of your nail plate that is caused by a fungus.  WHY ME? Fungal infections affect all ages, sexes, races, and creeds.  There may be many factors that predispose you to a fungal infection such as age, coexisting medical conditions such as diabetes, or an autoimmune disease; stress, medications, fatigue, genetics, etc.  Bottom line: fungus thrives in a warm, moist environment and your shoes offer such a location.  IS IT CONTAGIOUS? Theoretically, yes.  You do not want to share shoes, nail clippers or files with someone who has fungal toenails.  Walking around barefoot in the same room or sleeping in the same bed is unlikely to transfer the organism.  It is important to realize, however, that fungus can spread easily from one nail to the next on the same foot.  HOW DO WE TREAT THIS?  There are several ways to treat this condition.  Treatment may depend on many factors such as age, medications, pregnancy, liver and kidney conditions, etc.  It is best to ask your doctor which options are available to you.  1. No treatment.   Unlike many other medical concerns, you  can live with this condition.  However for many people this can be a painful condition and may lead to ingrown toenails or a bacterial infection.  It is recommended that you keep the nails cut short to help reduce the amount of fungal nail. 2. Topical treatment.  These range from herbal remedies to prescription strength nail lacquers.  About 40-50% effective, topicals require twice daily application for approximately 9 to 12 months or until an entirely new nail has grown out.  The most effective topicals are medical grade medications available through physicians offices. 3. Oral antifungal medications.  With an 80-90% cure rate,  the most common oral medication requires 3 to 4 months of therapy and stays in your system for a year as the new nail grows out.  Oral antifungal medications do require blood work to make sure it is a safe drug for you.  A liver function panel will be performed prior to starting the medication and after the first month of treatment.  It is important to have the blood work performed to avoid any harmful side effects.  In general, this medication safe but blood work is required. 4. Laser Therapy.  This treatment is performed by applying a specialized laser to the affected nail plate.  This therapy is noninvasive, fast, and non-painful.  It is not covered by insurance and is therefore, out of pocket.  The results have been very good with a 80-95% cure rate.  [facility] is the only practice in the area to offer this therapy. 5. Permanent Nail Avulsion.  Removing the entire nail so that a new nail will not grow back.

## 2018-03-12 MED FILL — ALPRAZolam 0.5 MG TABS: 0.5 | 90 days supply | Qty: 90 | Fill #0

## 2018-03-15 MED FILL — TRIAMCINOLONE 0.1% OINTMENT: 0.1 | 20 days supply | Qty: 80 | Fill #2

## 2018-03-22 MED FILL — LOSARTAN POTASSIUM 50 MG TA: 50 | 30 days supply | Qty: 30 | Fill #1

## 2018-03-29 ENCOUNTER — Encounter: Payer: Self-pay | Admitting: Podiatry

## 2018-03-31 ENCOUNTER — Ambulatory Visit: Payer: 59 | Admitting: Podiatry

## 2018-03-31 DIAGNOSIS — S90212A Contusion of left great toe with damage to nail, initial encounter: Secondary | ICD-10-CM

## 2018-03-31 NOTE — Patient Instructions (Signed)

## 2018-04-18 MED FILL — LOSARTAN POTASSIUM 50 MG TA: 50 | 30 days supply | Qty: 30 | Fill #2

## 2018-04-19 MED FILL — ACCU-CHEK GUIDE TEST STRIP: 75 days supply | Qty: 150 | Fill #4

## 2018-05-01 ENCOUNTER — Encounter: Payer: Self-pay | Admitting: Podiatry

## 2018-05-01 NOTE — Progress Notes (Signed)
Subjective:  Patient presents to clinic for follow up of s/p removal of left great toe.  Aletha Halim., PA-C is her PCP and last dos was 03/10/2018.   Current Outpatient Medications:  .  ACCU-CHEK GUIDE test strip, , Disp: , Rfl: 3 .  ALPRAZolam (XANAX) 0.5 MG tablet, Take 0.25 mg by mouth at bedtime as needed for sleep. , Disp: , Rfl: 1 .  aspirin EC 81 MG tablet, Take 81 mg by mouth every morning. , Disp: , Rfl:  .  azithromycin (ZITHROMAX) 250 MG tablet, Take by mouth daily., Disp: , Rfl:  .  benzonatate (TESSALON) 100 MG capsule, benzonatate 100 mg capsule  TAKE ONE CAPSULE BY MOUTH EVERY 8 HOURS AS NEEDED, Disp: , Rfl:  .  carvedilol (COREG) 25 MG tablet, Take 12.5 mg by mouth 2 (two) times daily with a meal. , Disp: , Rfl:  .  cephALEXin (KEFLEX) 500 MG capsule, Take 1 capsule (500 mg total) by mouth 2 (two) times daily. 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  clotrimazole-betamethasone (LOTRISONE) cream, Apply 1 application topically 2 (two) times daily., Disp: , Rfl: 0 .  Exenatide ER (BYDUREON) 2 MG SRER, Bydureon 2 mg subcutaneous extended release suspension, Disp: , Rfl:  .  fenofibrate micronized (LOFIBRA) 200 MG capsule, Take 200 mg by mouth daily before breakfast., Disp: , Rfl:  .  glimepiride (AMARYL) 1 MG tablet, glimepiride 1 mg tablet, Disp: , Rfl:  .  HYDROcodone-acetaminophen (NORCO) 5-325 MG tablet, Take 1-2 tablets by mouth every 4 (four) hours as needed for moderate pain or severe pain. Post-operatively, Disp: 20 tablet, Rfl: 0 .  HYDROcodone-homatropine (HYCODAN) 5-1.5 MG/5ML syrup, hydrocodone-homatropine 5 mg-1.5 mg/5 mL oral syrup, Disp: , Rfl:  .  ibuprofen (ADVIL,MOTRIN) 200 MG tablet, Take 600 mg by mouth every 6 (six) hours as needed for headache or moderate pain (pain from yeast infection). , Disp: , Rfl:  .  indapamide (LOZOL) 2.5 MG tablet, Take 2.5 mg by mouth daily., Disp: , Rfl:  .  INVOKANA 300 MG TABS  tablet, Take 300 mg by mouth daily before breakfast. , Disp: , Rfl: 1 .  liraglutide (VICTOZA) 18 MG/3ML SOPN, Victoza 3-Pak 0.6 mg/0.1 mL (18 mg/3 mL) subcutaneous pen injector, Disp: , Rfl:  .  losartan (COZAAR) 50 MG tablet, Take 50 mg by mouth every morning. , Disp: , Rfl: 1 .  losartan-hydrochlorothiazide (HYZAAR) 100-25 MG tablet, losartan 100 mg-hydrochlorothiazide 25 mg tablet, Disp: , Rfl:  .  meloxicam (MOBIC) 7.5 MG tablet, meloxicam 7.5 mg tablet  TK 1 T PO BID PRN P, Disp: , Rfl:  .  metFORMIN (GLUCOPHAGE) 500 MG tablet, Take 1,000 mg by mouth 2 (two) times daily with a meal. , Disp: , Rfl:  .  Omega-3 Fatty Acids (FISH OIL PO), Take 1 capsule by mouth daily., Disp: , Rfl:  .  pioglitazone (ACTOS) 15 MG tablet, pioglitazone 15 mg tablet, Disp: , Rfl:  .  polyethylene glycol-electrolytes (GAVILYTE-N WITH FLAVOR PACK) 420 g solution, GaviLyte-N 420 gram oral solution, Disp: , Rfl:  .  potassium chloride (K-DUR,KLOR-CON) 10 MEQ tablet, , Disp: , Rfl: 0 .  pravastatin (PRAVACHOL) 10 MG tablet, pravastatin 10 mg tablet, Disp: , Rfl:  .  senna-docusate (SENOKOT-S) 8.6-50 MG tablet, Take 1 tablet by mouth 2 (two) times daily. While taking strong pain meds to prevent constipation., Disp: 20 tablet, Rfl: 0 .  triamcinolone ointment (KENALOG) 0.1 %, ,  Disp: , Rfl: 5 .  UNIFINE PENTIPS 31G X 5 MM MISC, , Disp: , Rfl: 3   Allergies  Allergen Reactions  . Codeine Nausea And Vomiting  . Crestor [Rosuvastatin Calcium]     myalgias  . Lipitor  [Atorvastatin Calcium]   . Lipitor [Atorvastatin Calcium]     myalgias  . Ramipril     cough  . Toprol Xl [Metoprolol Succinate]     fatigue     Objective:  Physical Examination: Neurovascular status intact b/l  Evaluation of left great toe reveals nailbed healed and evidence of new nail plate at proximal nail border.  Laboratory Results from nail clippings:        Assessment: 1. Contusion left hallux secondary to trauma 2. S/p nail  avulsion left hallux 3. NIDDM  Plan: 1. She may resume normal activity 2. Follow up 3 months.

## 2018-05-10 MED FILL — PIOGLITAZONE HCL 15 MG TAB: 15 | 90 days supply | Qty: 90 | Fill #3

## 2018-05-12 DIAGNOSIS — B029 Zoster without complications: Secondary | ICD-10-CM | POA: Diagnosis not present

## 2018-05-12 DIAGNOSIS — H66002 Acute suppurative otitis media without spontaneous rupture of ear drum, left ear: Secondary | ICD-10-CM | POA: Diagnosis not present

## 2018-05-12 MED FILL — TRIAMCINOLONE 0.1% OINTMENT: 0.1 | 20 days supply | Qty: 80 | Fill #3

## 2018-05-13 MED FILL — AMOX-CLAV 875-125 MG TABLET: 875-125 | 10 days supply | Qty: 20 | Fill #0

## 2018-05-13 MED FILL — valACYclovir HCL 1 GM TABS: 1 | 7 days supply | Qty: 21 | Fill #0

## 2018-05-13 MED FILL — predniSONE 20 MG TABS: 20 | 9 days supply | Qty: 18 | Fill #0

## 2018-05-14 DIAGNOSIS — E782 Mixed hyperlipidemia: Secondary | ICD-10-CM | POA: Diagnosis not present

## 2018-05-14 DIAGNOSIS — E538 Deficiency of other specified B group vitamins: Secondary | ICD-10-CM | POA: Diagnosis not present

## 2018-05-14 DIAGNOSIS — E1165 Type 2 diabetes mellitus with hyperglycemia: Secondary | ICD-10-CM | POA: Diagnosis not present

## 2018-05-15 MED FILL — LOSARTAN POTASSIUM 50 MG TA: 50 | 30 days supply | Qty: 30 | Fill #3

## 2018-05-15 MED FILL — metFORMIN HCL ER 500 MG TB2: 500 | 90 days supply | Qty: 360 | Fill #1

## 2018-05-17 MED FILL — CARVEDILOL 25 MG TABLET: 25 | 90 days supply | Qty: 90 | Fill #0

## 2018-05-21 DIAGNOSIS — Z794 Long term (current) use of insulin: Secondary | ICD-10-CM | POA: Diagnosis not present

## 2018-05-21 DIAGNOSIS — E782 Mixed hyperlipidemia: Secondary | ICD-10-CM | POA: Diagnosis not present

## 2018-05-21 DIAGNOSIS — E538 Deficiency of other specified B group vitamins: Secondary | ICD-10-CM | POA: Diagnosis not present

## 2018-05-21 DIAGNOSIS — I10 Essential (primary) hypertension: Secondary | ICD-10-CM | POA: Diagnosis not present

## 2018-05-21 DIAGNOSIS — E1165 Type 2 diabetes mellitus with hyperglycemia: Secondary | ICD-10-CM | POA: Diagnosis not present

## 2018-05-22 MED FILL — UNIFINE PENTIPS 31GX3/16": 31G X 5 MM | 90 days supply | Qty: 100 | Fill #2

## 2018-05-22 MED FILL — UNIFINE PENTIPS 31GX3/16: 31G X 5 MM | 90 days supply | Qty: 100 | Fill #2

## 2018-05-24 MED FILL — INVOKANA 300 MG TABLET: 300 | 90 days supply | Qty: 90 | Fill #1

## 2018-05-24 MED FILL — PRAVASTATIN SODIUM 10 MG TA: 10 | 90 days supply | Qty: 90 | Fill #0

## 2018-05-26 MED FILL — VICTOZA 18 MG/3 ML INJECT P: 18 | 90 days supply | Qty: 27 | Fill #0

## 2018-06-14 MED FILL — LOSARTAN POTASSIUM 50 MG TA: 50 | 30 days supply | Qty: 30 | Fill #4

## 2018-06-16 DIAGNOSIS — B9789 Other viral agents as the cause of diseases classified elsewhere: Secondary | ICD-10-CM | POA: Diagnosis not present

## 2018-06-16 DIAGNOSIS — J029 Acute pharyngitis, unspecified: Secondary | ICD-10-CM | POA: Diagnosis not present

## 2018-06-16 DIAGNOSIS — J069 Acute upper respiratory infection, unspecified: Secondary | ICD-10-CM | POA: Diagnosis not present

## 2018-06-16 MED FILL — HYDROCODONE-HOMATROPINE SYR: 5-1.5 | 10 days supply | Qty: 50 | Fill #0

## 2018-06-21 MED FILL — ACCU-CHEK GUIDE TEST STRIP: 90 days supply | Qty: 200 | Fill #0

## 2018-07-07 ENCOUNTER — Ambulatory Visit: Payer: 59 | Admitting: Podiatry

## 2018-07-07 MED FILL — LOSARTAN POTASSIUM 50 MG TA: 50 | 90 days supply | Qty: 90 | Fill #5

## 2018-07-17 MED FILL — metFORMIN HCL ER 500 MG TB2: 500 | 90 days supply | Qty: 360 | Fill #2

## 2018-07-17 MED FILL — PRAVASTATIN SODIUM 10 MG TA: 10 | 90 days supply | Qty: 90 | Fill #1

## 2018-07-17 MED FILL — CARVEDILOL 25 MG TABLET: 25 | 90 days supply | Qty: 90 | Fill #1

## 2018-07-17 MED FILL — INVOKANA 300 MG TABLET: 300 | 90 days supply | Qty: 90 | Fill #2

## 2018-07-17 MED FILL — TRIAMCINOLONE 0.1% OINTMENT: 0.1 | 20 days supply | Qty: 80 | Fill #4

## 2018-07-17 MED FILL — UNIFINE PENTIPS 31GX3/16": 31G X 5 MM | 90 days supply | Qty: 100 | Fill #3

## 2018-07-17 MED FILL — VICTOZA 18 MG/3 ML INJECT P: 18 | 90 days supply | Qty: 27 | Fill #1

## 2018-07-17 MED FILL — UNIFINE PENTIPS 31GX3/16: 31G X 5 MM | 90 days supply | Qty: 100 | Fill #3

## 2018-07-19 MED FILL — PIOGLITAZONE HCL 15 MG TAB: 15 | 90 days supply | Qty: 90 | Fill #0

## 2018-09-08 DIAGNOSIS — Z0000 Encounter for general adult medical examination without abnormal findings: Secondary | ICD-10-CM | POA: Diagnosis not present

## 2018-09-08 DIAGNOSIS — E785 Hyperlipidemia, unspecified: Secondary | ICD-10-CM | POA: Diagnosis not present

## 2018-09-08 DIAGNOSIS — F419 Anxiety disorder, unspecified: Secondary | ICD-10-CM | POA: Diagnosis not present

## 2018-09-08 DIAGNOSIS — I10 Essential (primary) hypertension: Secondary | ICD-10-CM | POA: Diagnosis not present

## 2018-09-08 MED FILL — ALPRAZolam 0.5 MG TABS: 0.5 | 90 days supply | Qty: 90 | Fill #0

## 2018-09-08 MED FILL — TRIAMCINOLONE 0.1% OINTMENT: 0.1 | 40 days supply | Qty: 80 | Fill #0

## 2018-10-12 MED FILL — PIOGLITAZONE HCL 15 MG TAB: 15 | 90 days supply | Qty: 90 | Fill #1

## 2018-10-12 MED FILL — METFORMIN HCL ER 500 MG TB2: 500 | 30 days supply | Qty: 120 | Fill #3

## 2018-10-12 MED FILL — ACCU-CHEK GUIDE TEST STRIP: 90 days supply | Qty: 200 | Fill #1

## 2018-10-12 MED FILL — LOSARTAN POTASSIUM 50 MG TA: 50 | 30 days supply | Qty: 30 | Fill #0

## 2018-10-12 MED FILL — VICTOZA 18 MG/3 ML INJECT P: 18 | 90 days supply | Qty: 27 | Fill #2

## 2018-10-21 MED FILL — DOXYCYCLINE HYC 100 MG CAPS: 100 | 10 days supply | Qty: 20 | Fill #0

## 2018-10-21 MED FILL — MUPIROCIN 2% OINTMENT: 2 | 10 days supply | Qty: 22 | Fill #0

## 2018-11-03 DIAGNOSIS — D225 Melanocytic nevi of trunk: Secondary | ICD-10-CM | POA: Diagnosis not present

## 2018-11-03 DIAGNOSIS — D1801 Hemangioma of skin and subcutaneous tissue: Secondary | ICD-10-CM | POA: Diagnosis not present

## 2018-11-03 DIAGNOSIS — L570 Actinic keratosis: Secondary | ICD-10-CM | POA: Diagnosis not present

## 2018-11-03 DIAGNOSIS — L720 Epidermal cyst: Secondary | ICD-10-CM | POA: Diagnosis not present

## 2018-11-03 DIAGNOSIS — L814 Other melanin hyperpigmentation: Secondary | ICD-10-CM | POA: Diagnosis not present

## 2018-11-03 DIAGNOSIS — L821 Other seborrheic keratosis: Secondary | ICD-10-CM | POA: Diagnosis not present

## 2018-11-04 MED FILL — FLUOCINONIDE 0.05% CREAM: 0.05 | 14 days supply | Qty: 15 | Fill #0

## 2018-11-15 DIAGNOSIS — E782 Mixed hyperlipidemia: Secondary | ICD-10-CM | POA: Diagnosis not present

## 2018-11-15 DIAGNOSIS — E1165 Type 2 diabetes mellitus with hyperglycemia: Secondary | ICD-10-CM | POA: Diagnosis not present

## 2018-11-15 MED FILL — LOSARTAN POTASSIUM 50 MG TA: 50 | 30 days supply | Qty: 30 | Fill #1

## 2018-11-15 MED FILL — METFORMIN HCL ER 500 MG TB2: 500 | 30 days supply | Qty: 120 | Fill #4

## 2018-11-19 DIAGNOSIS — E538 Deficiency of other specified B group vitamins: Secondary | ICD-10-CM | POA: Diagnosis not present

## 2018-11-19 DIAGNOSIS — E1165 Type 2 diabetes mellitus with hyperglycemia: Secondary | ICD-10-CM | POA: Diagnosis not present

## 2018-11-19 DIAGNOSIS — I10 Essential (primary) hypertension: Secondary | ICD-10-CM | POA: Diagnosis not present

## 2018-11-19 DIAGNOSIS — E782 Mixed hyperlipidemia: Secondary | ICD-10-CM | POA: Diagnosis not present

## 2018-11-29 MED FILL — UNIFINE PENTIPS 31GX3/16: 31G X 5 MM | 90 days supply | Qty: 100 | Fill #0

## 2018-11-29 MED FILL — PRAVASTATIN SODIUM 10 MG TA: 10 | 90 days supply | Qty: 90 | Fill #2

## 2018-11-29 MED FILL — UNIFINE PENTIPS 31GX3/16": 31G X 5 MM | 90 days supply | Qty: 100 | Fill #0

## 2018-11-29 MED FILL — CARVEDILOL 25 MG TABLET: 25 | 90 days supply | Qty: 90 | Fill #2

## 2018-12-19 MED FILL — LOSARTAN POTASSIUM 50 MG TA: 50 | 30 days supply | Qty: 30 | Fill #2

## 2018-12-19 MED FILL — ALPRAZolam 0.5 MG TABS: 0.5 | 90 days supply | Qty: 90 | Fill #1

## 2018-12-20 MED FILL — MUPIROCIN 2% OINTMENT: 2 | 10 days supply | Qty: 22 | Fill #1

## 2018-12-20 MED FILL — metFORMIN HCL ER 500 MG TB2: 500 | 30 days supply | Qty: 120 | Fill #5

## 2019-01-08 MED FILL — VICTOZA 18 MG/3 ML INJECT P: 18 | 90 days supply | Qty: 27 | Fill #3

## 2019-01-08 MED FILL — PIOGLITAZONE HCL 15 MG TAB: 15 | 90 days supply | Qty: 90 | Fill #2

## 2019-01-08 MED FILL — INVOKANA 300 MG TABLET: 300 | 30 days supply | Qty: 30 | Fill #3

## 2019-01-15 MED FILL — ACCU-CHEK GUIDE TEST STRIP: 90 days supply | Qty: 200 | Fill #2

## 2019-01-15 MED FILL — LOSARTAN POTASSIUM 50 MG TA: 50 | 30 days supply | Qty: 30 | Fill #3

## 2019-01-17 MED FILL — METFORMIN HCL ER 500 MG TB2: 500 | 30 days supply | Qty: 120 | Fill #0

## 2019-02-01 MED FILL — MUPIROCIN 2% OINTMENT: 2 | 10 days supply | Qty: 22 | Fill #2

## 2019-02-11 DIAGNOSIS — E782 Mixed hyperlipidemia: Secondary | ICD-10-CM | POA: Diagnosis not present

## 2019-02-11 DIAGNOSIS — E1165 Type 2 diabetes mellitus with hyperglycemia: Secondary | ICD-10-CM | POA: Diagnosis not present

## 2019-02-11 DIAGNOSIS — E538 Deficiency of other specified B group vitamins: Secondary | ICD-10-CM | POA: Diagnosis not present

## 2019-02-15 DIAGNOSIS — E119 Type 2 diabetes mellitus without complications: Secondary | ICD-10-CM | POA: Diagnosis not present

## 2019-02-18 DIAGNOSIS — E538 Deficiency of other specified B group vitamins: Secondary | ICD-10-CM | POA: Diagnosis not present

## 2019-02-18 DIAGNOSIS — Z7984 Long term (current) use of oral hypoglycemic drugs: Secondary | ICD-10-CM | POA: Diagnosis not present

## 2019-02-18 DIAGNOSIS — I10 Essential (primary) hypertension: Secondary | ICD-10-CM | POA: Diagnosis not present

## 2019-02-18 DIAGNOSIS — E1165 Type 2 diabetes mellitus with hyperglycemia: Secondary | ICD-10-CM | POA: Diagnosis not present

## 2019-02-18 DIAGNOSIS — E782 Mixed hyperlipidemia: Secondary | ICD-10-CM | POA: Diagnosis not present

## 2019-02-18 MED FILL — PRAVASTATIN NA 40 MG TAB: 40 | 90 days supply | Qty: 90 | Fill #0

## 2019-02-20 MED FILL — LOSARTAN POTASSIUM 50 MG TA: 50 | 30 days supply | Qty: 30 | Fill #4

## 2019-02-21 MED FILL — metFORMIN HCL ER 500 MG TB2: 500 | 30 days supply | Qty: 120 | Fill #1

## 2019-02-28 MED FILL — UNIFINE PENTIPS 31GX3/16": 31G X 5 MM | 90 days supply | Qty: 100 | Fill #1

## 2019-02-28 MED FILL — UNIFINE PENTIPS 31GX3/16: 31G X 5 MM | 90 days supply | Qty: 100 | Fill #1

## 2019-02-28 MED FILL — CARVEDILOL 25 MG TABLET: 25 | 90 days supply | Qty: 90 | Fill #3

## 2019-03-09 DIAGNOSIS — Z124 Encounter for screening for malignant neoplasm of cervix: Secondary | ICD-10-CM | POA: Diagnosis not present

## 2019-03-09 DIAGNOSIS — Z01419 Encounter for gynecological examination (general) (routine) without abnormal findings: Secondary | ICD-10-CM | POA: Diagnosis not present

## 2019-03-09 DIAGNOSIS — Z1231 Encounter for screening mammogram for malignant neoplasm of breast: Secondary | ICD-10-CM | POA: Diagnosis not present

## 2019-03-22 MED FILL — ALPRAZolam 0.5 MG TABS: 0.5 | 90 days supply | Qty: 90 | Fill #0

## 2019-03-28 MED FILL — metFORMIN HCL ER 500 MG TB2: 500 | 30 days supply | Qty: 120 | Fill #0

## 2019-03-28 MED FILL — LOSARTAN POTASSIUM 50 MG TA: 50 | 90 days supply | Qty: 90 | Fill #0

## 2019-04-04 DIAGNOSIS — E119 Type 2 diabetes mellitus without complications: Secondary | ICD-10-CM | POA: Diagnosis not present

## 2019-04-04 DIAGNOSIS — F419 Anxiety disorder, unspecified: Secondary | ICD-10-CM | POA: Diagnosis not present

## 2019-04-04 DIAGNOSIS — G4700 Insomnia, unspecified: Secondary | ICD-10-CM | POA: Diagnosis not present

## 2019-04-04 DIAGNOSIS — E785 Hyperlipidemia, unspecified: Secondary | ICD-10-CM | POA: Diagnosis not present

## 2019-04-04 DIAGNOSIS — I10 Essential (primary) hypertension: Secondary | ICD-10-CM | POA: Diagnosis not present

## 2019-04-10 MED FILL — PIOGLITAZONE HCL 15 MG TAB: 15 | 90 days supply | Qty: 90 | Fill #3

## 2019-04-11 DIAGNOSIS — N261 Atrophy of kidney (terminal): Secondary | ICD-10-CM | POA: Diagnosis not present

## 2019-04-11 DIAGNOSIS — R1084 Generalized abdominal pain: Secondary | ICD-10-CM | POA: Diagnosis not present

## 2019-04-11 DIAGNOSIS — N200 Calculus of kidney: Secondary | ICD-10-CM | POA: Diagnosis not present

## 2019-04-11 MED FILL — TAMSULOSIN HCL 0.4 MG CAP: 0.4 | 30 days supply | Qty: 30 | Fill #0

## 2019-04-11 MED FILL — VICTOZA 18 MG/3 ML INJECT P: 18 | 90 days supply | Qty: 27 | Fill #0

## 2019-04-11 MED FILL — OXYCODONE-APAP 5-325MG: 5-325 | 4 days supply | Qty: 14 | Fill #0

## 2019-04-25 MED FILL — metFORMIN HCL ER 500 MG TB2: 500 | 30 days supply | Qty: 120 | Fill #1

## 2019-05-04 DIAGNOSIS — F411 Generalized anxiety disorder: Secondary | ICD-10-CM | POA: Diagnosis not present

## 2019-05-04 DIAGNOSIS — F320 Major depressive disorder, single episode, mild: Secondary | ICD-10-CM | POA: Diagnosis not present

## 2019-05-04 MED FILL — INDAPAMIDE 2.5 MG TAB: 2.5 | 90 days supply | Qty: 180 | Fill #0

## 2019-05-18 DIAGNOSIS — E1165 Type 2 diabetes mellitus with hyperglycemia: Secondary | ICD-10-CM | POA: Diagnosis not present

## 2019-05-18 DIAGNOSIS — E782 Mixed hyperlipidemia: Secondary | ICD-10-CM | POA: Diagnosis not present

## 2019-05-20 DIAGNOSIS — F320 Major depressive disorder, single episode, mild: Secondary | ICD-10-CM | POA: Diagnosis not present

## 2019-05-20 DIAGNOSIS — F411 Generalized anxiety disorder: Secondary | ICD-10-CM | POA: Diagnosis not present

## 2019-05-25 DIAGNOSIS — E782 Mixed hyperlipidemia: Secondary | ICD-10-CM | POA: Diagnosis not present

## 2019-05-25 DIAGNOSIS — E1165 Type 2 diabetes mellitus with hyperglycemia: Secondary | ICD-10-CM | POA: Diagnosis not present

## 2019-05-25 DIAGNOSIS — I10 Essential (primary) hypertension: Secondary | ICD-10-CM | POA: Diagnosis not present

## 2019-05-25 DIAGNOSIS — E538 Deficiency of other specified B group vitamins: Secondary | ICD-10-CM | POA: Diagnosis not present

## 2019-05-25 MED FILL — JARDIANCE 25 MG TABLET: 25 | 90 days supply | Qty: 90 | Fill #0

## 2019-05-25 MED FILL — PRAVASTATIN NA 40 MG TAB: 40 | 90 days supply | Qty: 90 | Fill #0

## 2019-05-31 MED FILL — FREESTYLE LITE TEST STRIP: 90 days supply | Qty: 200 | Fill #0

## 2019-05-31 MED FILL — INVOKANA 300 MG TABLET: 300 | 90 days supply | Qty: 90 | Fill #0

## 2019-05-31 MED FILL — FREESTYLE LITE METER: 1 days supply | Qty: 1 | Fill #0

## 2019-06-10 DIAGNOSIS — F411 Generalized anxiety disorder: Secondary | ICD-10-CM | POA: Diagnosis not present

## 2019-06-10 DIAGNOSIS — F320 Major depressive disorder, single episode, mild: Secondary | ICD-10-CM | POA: Diagnosis not present

## 2019-06-13 MED FILL — metFORMIN HCL ER 500 MG TB2: 500 | 90 days supply | Qty: 360 | Fill #0

## 2019-06-13 MED FILL — CARVEDILOL 25 MG TABLET: 25 | 90 days supply | Qty: 90 | Fill #0

## 2019-06-22 MED FILL — ALPRAZolam 0.5 MG TABS: 0.5 | 90 days supply | Qty: 90 | Fill #1

## 2019-06-22 MED FILL — UNIFINE PENTIPS 31GX3/16: 31G X 5 MM | 90 days supply | Qty: 100 | Fill #2

## 2019-06-22 MED FILL — LOSARTAN POTASSIUM 50 MG TA: 50 | 90 days supply | Qty: 90 | Fill #0

## 2019-06-22 MED FILL — UNIFINE PENTIPS 31GX3/16": 31G X 5 MM | 90 days supply | Qty: 100 | Fill #2

## 2019-07-06 MED FILL — PIOGLITAZONE HCL 15 MG TAB: 15 | 90 days supply | Qty: 90 | Fill #0

## 2019-07-08 MED FILL — VICTOZA 18 MG/3 ML INJECT P: 18 | 90 days supply | Qty: 27 | Fill #1

## 2019-09-05 MED FILL — CARVEDILOL 25 MG TABLET: 25 | 90 days supply | Qty: 90 | Fill #1

## 2019-09-05 MED FILL — FREESTYLE LITE TEST STRIP: 90 days supply | Qty: 200 | Fill #1

## 2019-09-05 MED FILL — metFORMIN HCL ER 500 MG TB2: 500 | 90 days supply | Qty: 360 | Fill #1

## 2019-09-07 DIAGNOSIS — E782 Mixed hyperlipidemia: Secondary | ICD-10-CM | POA: Diagnosis not present

## 2019-09-07 DIAGNOSIS — E538 Deficiency of other specified B group vitamins: Secondary | ICD-10-CM | POA: Diagnosis not present

## 2019-09-07 DIAGNOSIS — E1165 Type 2 diabetes mellitus with hyperglycemia: Secondary | ICD-10-CM | POA: Diagnosis not present

## 2019-09-14 DIAGNOSIS — I10 Essential (primary) hypertension: Secondary | ICD-10-CM | POA: Diagnosis not present

## 2019-09-14 DIAGNOSIS — E782 Mixed hyperlipidemia: Secondary | ICD-10-CM | POA: Diagnosis not present

## 2019-09-14 DIAGNOSIS — E1165 Type 2 diabetes mellitus with hyperglycemia: Secondary | ICD-10-CM | POA: Diagnosis not present

## 2019-09-14 DIAGNOSIS — E559 Vitamin D deficiency, unspecified: Secondary | ICD-10-CM | POA: Diagnosis not present

## 2019-09-14 DIAGNOSIS — E538 Deficiency of other specified B group vitamins: Secondary | ICD-10-CM | POA: Diagnosis not present

## 2019-09-20 MED FILL — LOSARTAN POTASSIUM 50 MG TA: 50 | 90 days supply | Qty: 90 | Fill #1

## 2019-09-20 MED FILL — ALPRAZolam 0.5 MG TABS: 0.5 | 90 days supply | Qty: 90 | Fill #0

## 2019-09-28 DIAGNOSIS — F419 Anxiety disorder, unspecified: Secondary | ICD-10-CM | POA: Diagnosis not present

## 2019-09-28 DIAGNOSIS — E785 Hyperlipidemia, unspecified: Secondary | ICD-10-CM | POA: Diagnosis not present

## 2019-09-28 DIAGNOSIS — I10 Essential (primary) hypertension: Secondary | ICD-10-CM | POA: Diagnosis not present

## 2019-09-28 DIAGNOSIS — E119 Type 2 diabetes mellitus without complications: Secondary | ICD-10-CM | POA: Diagnosis not present

## 2019-09-28 DIAGNOSIS — Z0000 Encounter for general adult medical examination without abnormal findings: Secondary | ICD-10-CM | POA: Diagnosis not present

## 2019-09-28 DIAGNOSIS — E559 Vitamin D deficiency, unspecified: Secondary | ICD-10-CM | POA: Diagnosis not present

## 2019-10-03 MED FILL — UNIFINE PENTIPS 31GX3/16: 31G X 5 MM | 90 days supply | Qty: 100 | Fill #3

## 2019-10-03 MED FILL — PIOGLITAZONE HCL 15 MG TAB: 15 | 90 days supply | Qty: 90 | Fill #1

## 2019-11-01 MED FILL — MUPIROCIN 2% OINTMENT: 2 | 7 days supply | Qty: 22 | Fill #1

## 2019-11-21 MED FILL — PRAVASTATIN NA 40 MG TAB: 40 | 90 days supply | Qty: 90 | Fill #2

## 2019-11-22 MED FILL — FARXIGA 10 MG TABLET: 10 | 90 days supply | Qty: 90 | Fill #0

## 2019-11-29 DIAGNOSIS — H66002 Acute suppurative otitis media without spontaneous rupture of ear drum, left ear: Secondary | ICD-10-CM | POA: Diagnosis not present

## 2019-11-29 DIAGNOSIS — H6503 Acute serous otitis media, bilateral: Secondary | ICD-10-CM | POA: Diagnosis not present

## 2019-11-29 DIAGNOSIS — H9311 Tinnitus, right ear: Secondary | ICD-10-CM | POA: Diagnosis not present

## 2019-11-29 DIAGNOSIS — H6121 Impacted cerumen, right ear: Secondary | ICD-10-CM | POA: Diagnosis not present

## 2019-11-30 MED FILL — AMOX-CLAV 875-125 MG TABLET: 875-125 | 5 days supply | Qty: 10 | Fill #0

## 2019-11-30 MED FILL — FLUTICASONE PROP 50 MCG SPR: 50 | 60 days supply | Qty: 16 | Fill #0

## 2019-12-06 MED FILL — metFORMIN HCL ER 500 MG TB2: 500 | 90 days supply | Qty: 360 | Fill #2

## 2019-12-06 MED FILL — MUPIROCIN 2% OINTMENT: 2 | 7 days supply | Qty: 22 | Fill #2

## 2019-12-06 MED FILL — FREESTYLE LITE TEST STRIP: 90 days supply | Qty: 200 | Fill #2

## 2019-12-06 MED FILL — CARVEDILOL 25 MG TABLET: 25 | 90 days supply | Qty: 90 | Fill #2

## 2019-12-16 DIAGNOSIS — H903 Sensorineural hearing loss, bilateral: Secondary | ICD-10-CM | POA: Diagnosis not present

## 2019-12-17 MED FILL — LOSARTAN POTASSIUM 50 MG TA: 50 | 90 days supply | Qty: 90 | Fill #2

## 2019-12-17 MED FILL — ALPRAZolam 0.5 MG TABS: 0.5 | 90 days supply | Qty: 90 | Fill #1

## 2019-12-30 MED FILL — PIOGLITAZONE HCL 15 MG TABS: 15 | 90 days supply | Qty: 90 | Fill #2

## 2019-12-30 MED FILL — VICTOZA 18 MG/3 ML INJECT P: 18 | 90 days supply | Qty: 27 | Fill #3

## 2019-12-30 MED FILL — UNIFINE PENTIPS 31GX3/16: 31G X 5 MM | 90 days supply | Qty: 100 | Fill #0

## 2020-01-03 DIAGNOSIS — N200 Calculus of kidney: Secondary | ICD-10-CM | POA: Diagnosis not present

## 2020-01-03 MED FILL — INDAPAMIDE 2.5 MG TAB: 2.5 | 90 days supply | Qty: 180 | Fill #0

## 2020-01-06 DIAGNOSIS — E119 Type 2 diabetes mellitus without complications: Secondary | ICD-10-CM | POA: Diagnosis not present

## 2020-01-11 DIAGNOSIS — N200 Calculus of kidney: Secondary | ICD-10-CM | POA: Diagnosis not present

## 2020-01-13 DIAGNOSIS — B029 Zoster without complications: Secondary | ICD-10-CM | POA: Diagnosis not present

## 2020-01-13 MED FILL — valACYclovir HCL 1 GM TABS: 1 | 7 days supply | Qty: 21 | Fill #0

## 2020-01-18 DIAGNOSIS — L2089 Other atopic dermatitis: Secondary | ICD-10-CM | POA: Diagnosis not present

## 2020-01-18 DIAGNOSIS — L814 Other melanin hyperpigmentation: Secondary | ICD-10-CM | POA: Diagnosis not present

## 2020-01-18 DIAGNOSIS — L918 Other hypertrophic disorders of the skin: Secondary | ICD-10-CM | POA: Diagnosis not present

## 2020-01-18 DIAGNOSIS — L570 Actinic keratosis: Secondary | ICD-10-CM | POA: Diagnosis not present

## 2020-01-18 DIAGNOSIS — B029 Zoster without complications: Secondary | ICD-10-CM | POA: Diagnosis not present

## 2020-01-18 DIAGNOSIS — L130 Dermatitis herpetiformis: Secondary | ICD-10-CM | POA: Diagnosis not present

## 2020-01-18 MED FILL — BETAMETHASONE DP 0.05% CRM: 0.05 | 22 days supply | Qty: 45 | Fill #0

## 2020-08-13 ENCOUNTER — Other Ambulatory Visit (HOSPITAL_COMMUNITY): Payer: Self-pay

## 2020-08-13 MED ORDER — TOBRAMYCIN 0.3 % OP SOLN
OPHTHALMIC | 1 refills | Status: AC
  Filled 2020-08-13: qty 5, 7d supply, fill #0
  Filled 2020-10-15: qty 5, 8d supply, fill #1

## 2020-10-15 ENCOUNTER — Other Ambulatory Visit (HOSPITAL_COMMUNITY): Payer: Self-pay

## 2020-10-15 MED ORDER — BETAMETHASONE DIPROPIONATE 0.05 % EX CREA
TOPICAL_CREAM | CUTANEOUS | 1 refills | Status: AC
  Filled 2020-10-15: qty 45, 30d supply, fill #0

## 2020-10-15 MED ORDER — FLUTICASONE PROPIONATE 50 MCG/ACT NA SUSP
NASAL | 0 refills | Status: AC
  Filled 2020-10-15: qty 16, 60d supply, fill #0

## 2020-11-08 ENCOUNTER — Other Ambulatory Visit: Payer: Self-pay | Admitting: Obstetrics and Gynecology

## 2020-11-08 DIAGNOSIS — Z13820 Encounter for screening for osteoporosis: Secondary | ICD-10-CM

## 2020-11-08 DIAGNOSIS — N6459 Other signs and symptoms in breast: Secondary | ICD-10-CM

## 2020-11-23 ENCOUNTER — Other Ambulatory Visit: Payer: Self-pay | Admitting: Obstetrics and Gynecology

## 2020-11-23 DIAGNOSIS — N6459 Other signs and symptoms in breast: Secondary | ICD-10-CM

## 2020-11-23 DIAGNOSIS — R21 Rash and other nonspecific skin eruption: Secondary | ICD-10-CM

## 2020-11-28 ENCOUNTER — Other Ambulatory Visit: Payer: Self-pay

## 2020-11-28 ENCOUNTER — Ambulatory Visit
Admission: RE | Admit: 2020-11-28 | Discharge: 2020-11-28 | Disposition: A | Discharge status: Home / Self Care | Payer: Medicare HMO | Source: Ambulatory Visit | Attending: Obstetrics and Gynecology | Admitting: Obstetrics and Gynecology

## 2020-11-28 DIAGNOSIS — R21 Rash and other nonspecific skin eruption: Secondary | ICD-10-CM

## 2020-11-28 DIAGNOSIS — N6459 Other signs and symptoms in breast: Secondary | ICD-10-CM

## 2021-05-24 ENCOUNTER — Ambulatory Visit
Admission: RE | Admit: 2021-05-24 | Discharge: 2021-05-24 | Disposition: A | Discharge status: Home / Self Care | Payer: Medicare HMO | Source: Ambulatory Visit | Attending: Obstetrics and Gynecology | Admitting: Obstetrics and Gynecology

## 2021-05-24 DIAGNOSIS — Z13820 Encounter for screening for osteoporosis: Secondary | ICD-10-CM

## 2021-08-10 IMAGING — MG DIGITAL DIAGNOSTIC BILAT W/ TOMO W/ CAD
8 series · 8 of 24 positions shown · non-contrast
Comparison: Previous exam(s).

CLINICAL DATA: Patient states that she a tick bite on the left
breast and had an intermittent rash that has now resolved. She has
no current complaints. The patient has a frozen left shoulder.

EXAM:
DIGITAL DIAGNOSTIC BILATERAL MAMMOGRAM WITH TOMOSYNTHESIS AND CAD
TECHNIQUE: Bilateral digital diagnostic mammography and breast tomosynthesis
was performed. The images were evaluated with computer-aided
detection.

[L MLO synth-2D]
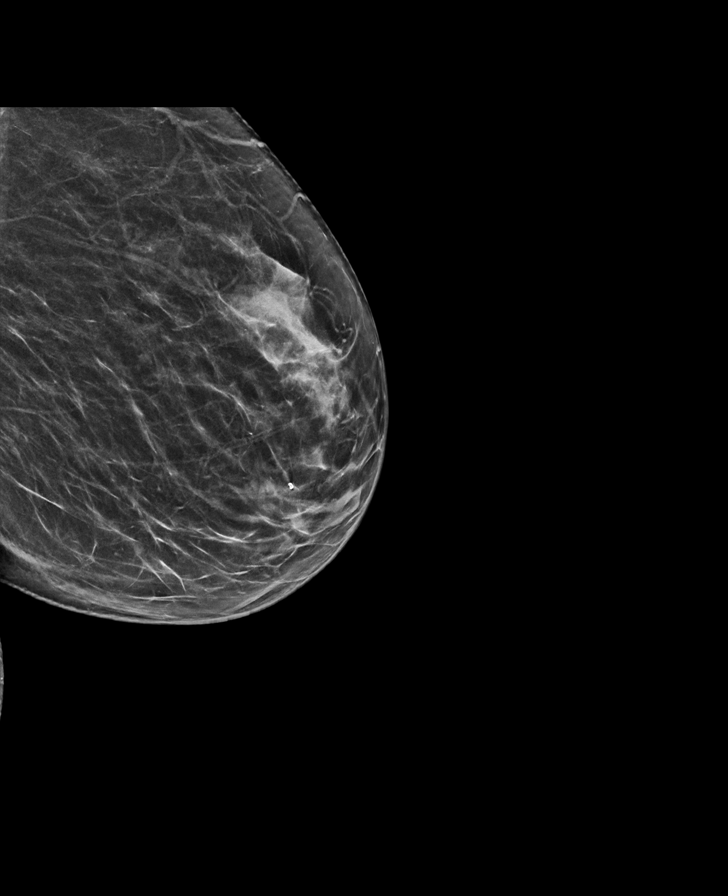

[L CC synth-2D]
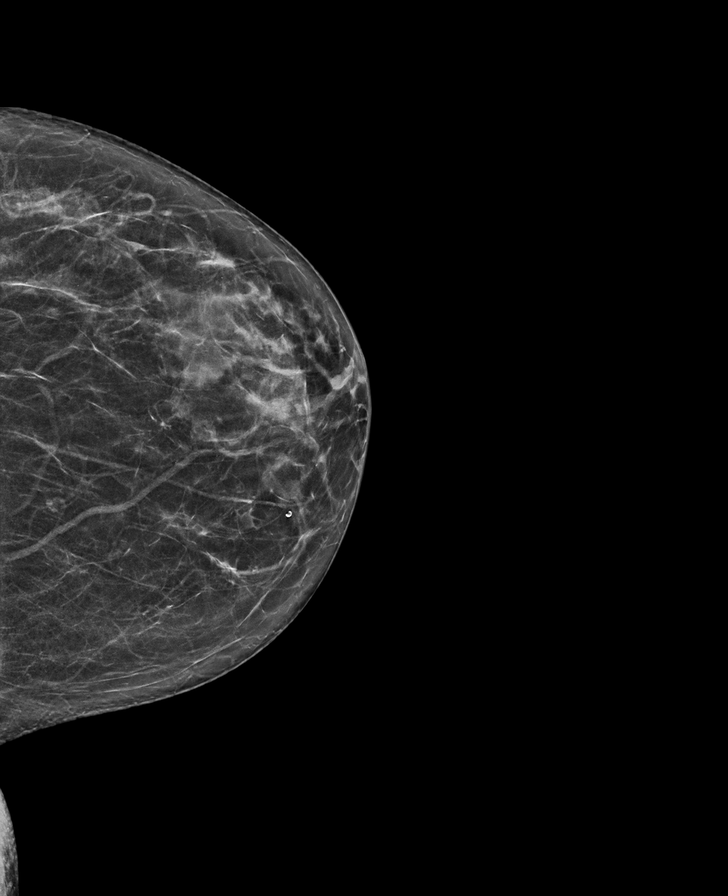

[R CC synth-2D]
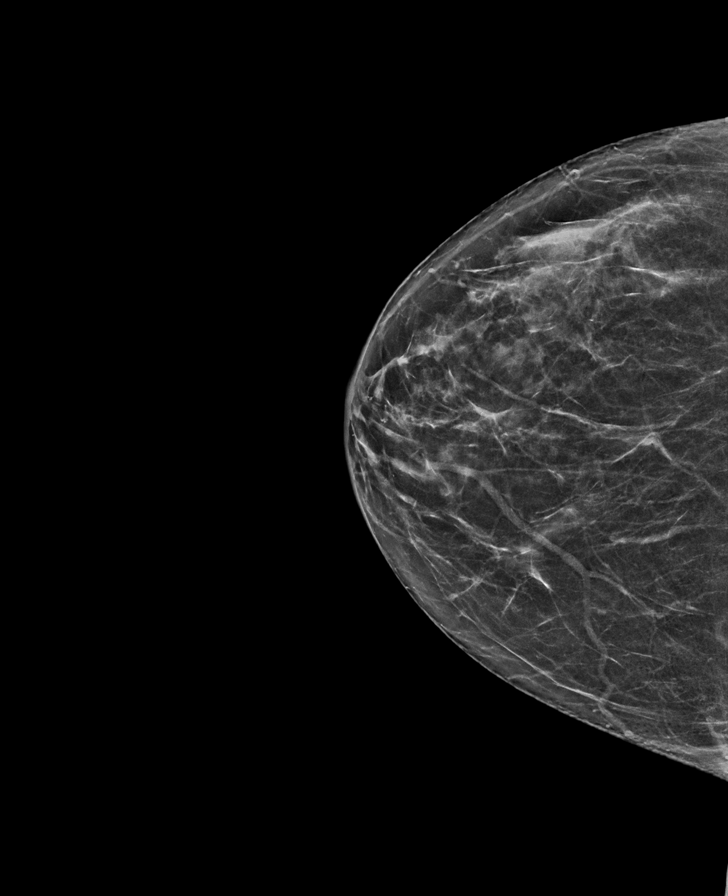

[R MLO synth-2D]
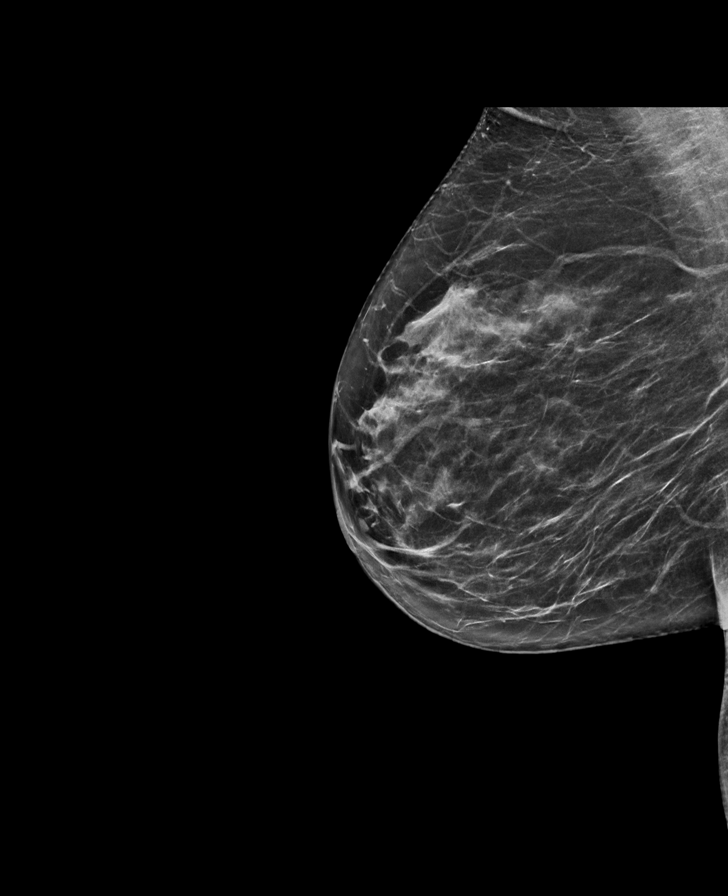

[L CC tomo · tomo slice 33/64.0]
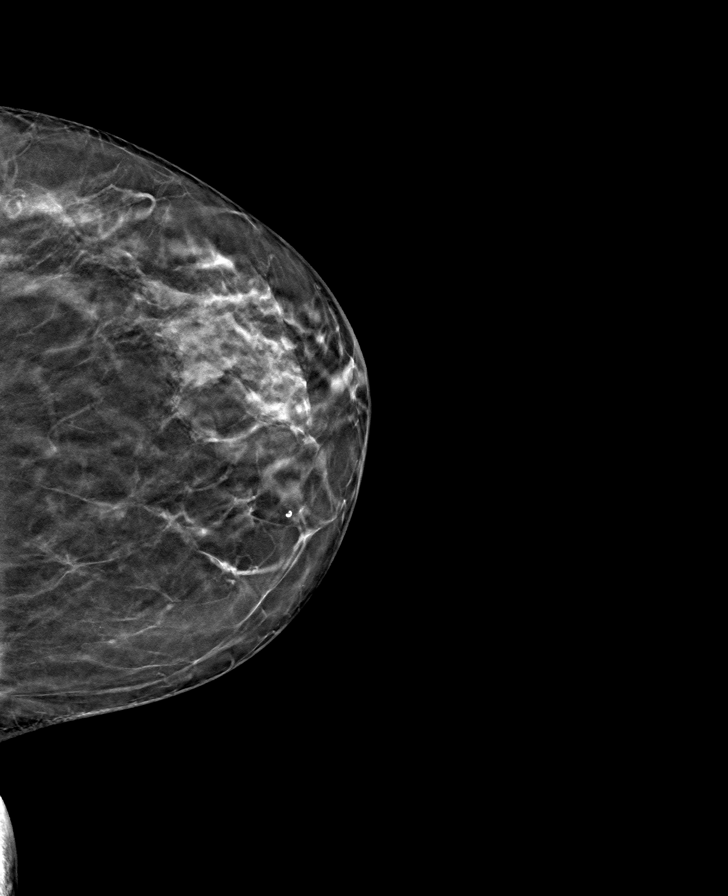

[R MLO tomo · tomo slice 35/68.0]
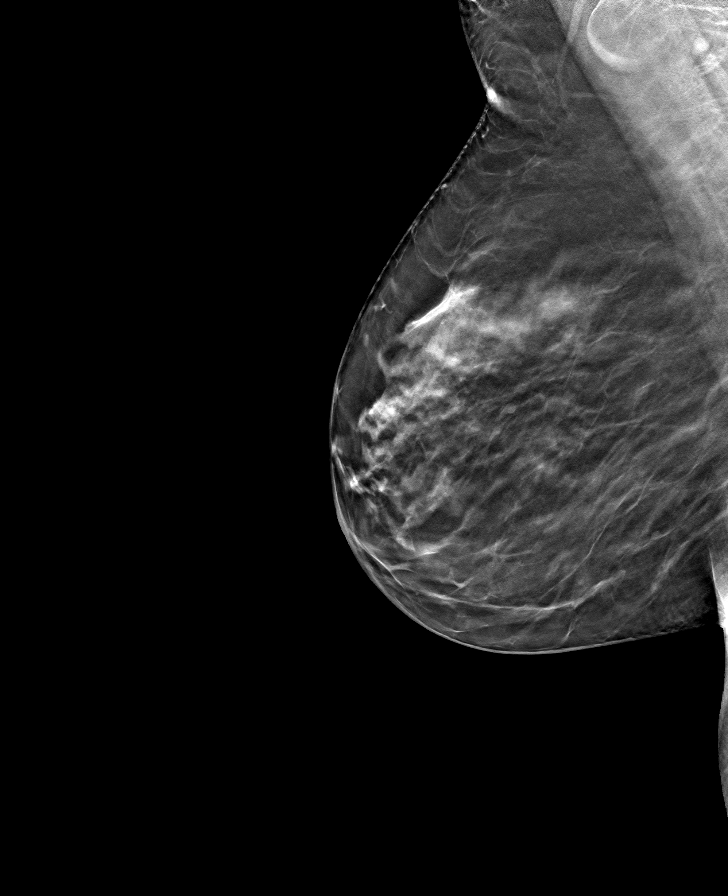

[R CC tomo · tomo slice 31/60.0]
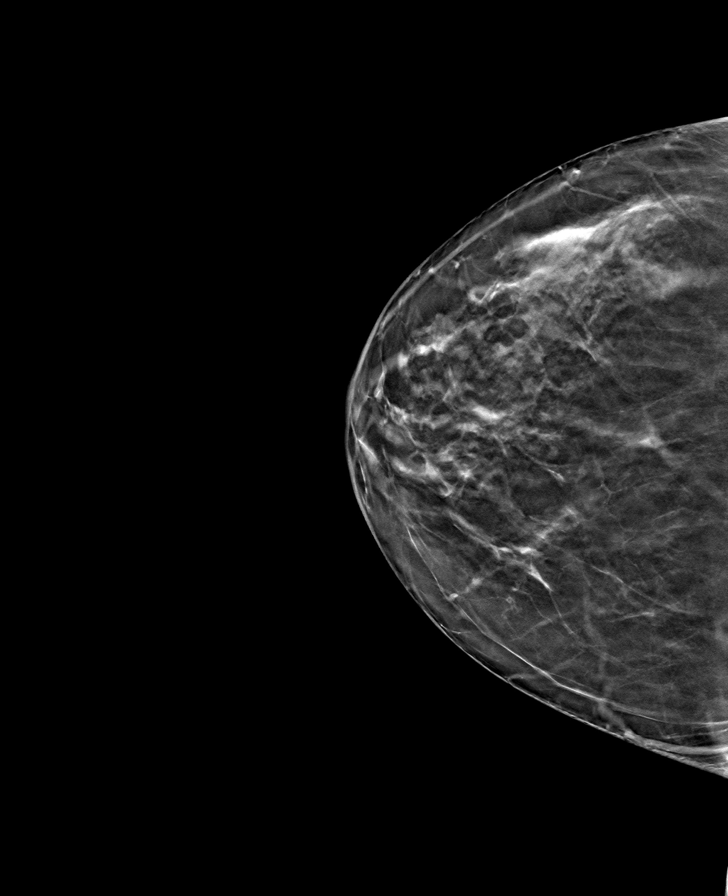

[L MLO tomo · tomo slice 36/71.0]
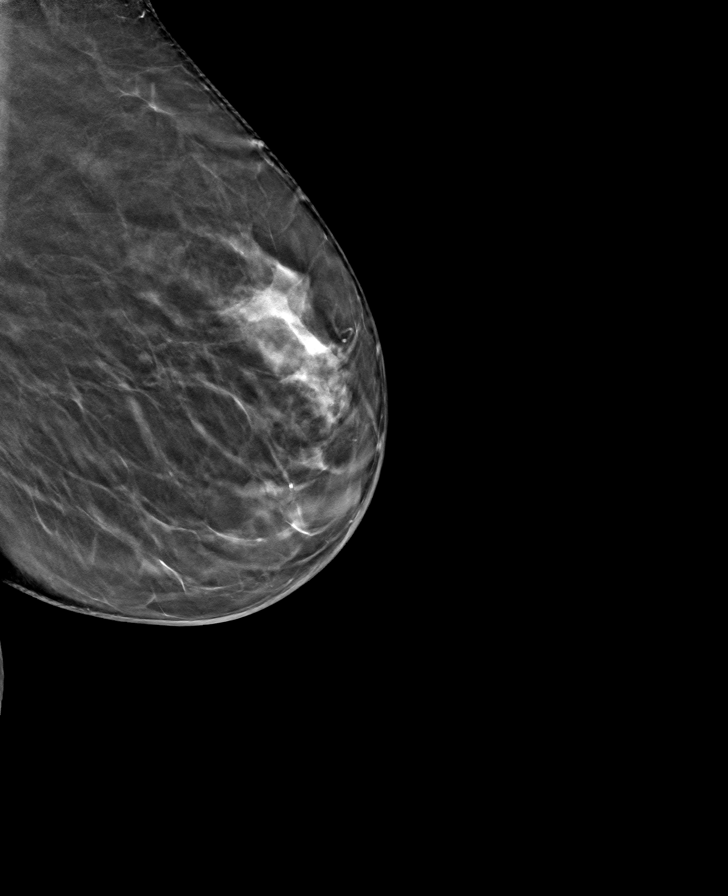

[8 of 24 positions shown; findings below may reference images not displayed]

ACR Breast Density Category b: There are scattered areas of
fibroglandular density.
FINDINGS: No suspicious mass, malignant type microcalcifications or distortion
detected in either breast.
IMPRESSION: No evidence of malignancy in either breast.

RECOMMENDATION:
Bilateral screening mammogram in 1 year is recommended.

I have discussed the findings and recommendations with the patient.
If applicable, a reminder letter will be sent to the patient
regarding the next appointment.

BI-RADS CATEGORY  1: Negative.

## 2021-10-10 ENCOUNTER — Other Ambulatory Visit (HOSPITAL_BASED_OUTPATIENT_CLINIC_OR_DEPARTMENT_OTHER): Payer: Self-pay

## 2021-10-10 MED ORDER — EXEL COMFORT POINT PEN NEEDLE 31G X 6 MM MISC
5 refills | Status: AC
  Filled 2021-10-10: qty 100, 90d supply, fill #0

## 2021-10-10 MED ORDER — GLUCOSE BLOOD VI STRP
ORAL_STRIP | 3 refills | Status: AC
  Filled 2021-10-10: qty 100, 50d supply, fill #0

## 2021-10-10 MED ORDER — FARXIGA 10 MG PO TABS
10.0000 mg | ORAL_TABLET | Freq: Every day | ORAL | 3 refills | Status: AC
  Filled 2021-10-10: qty 90, 90d supply, fill #0

## 2021-10-10 MED ORDER — FIFTY50 PEN NEEDLES 31G X 5 MM MISC
3 refills | Status: AC
  Filled 2021-10-10: qty 100, 90d supply, fill #0

## 2021-10-10 MED ORDER — VICTOZA 18 MG/3ML ~~LOC~~ SOPN
1.8000 mg | PEN_INJECTOR | Freq: Every day | SUBCUTANEOUS | 3 refills | Status: AC
  Filled 2021-10-10: qty 27, 90d supply, fill #0

## 2021-11-11 ENCOUNTER — Other Ambulatory Visit: Payer: Self-pay | Admitting: Obstetrics and Gynecology

## 2021-11-11 DIAGNOSIS — Z1231 Encounter for screening mammogram for malignant neoplasm of breast: Secondary | ICD-10-CM

## 2021-11-29 ENCOUNTER — Ambulatory Visit
Admission: RE | Admit: 2021-11-29 | Discharge: 2021-11-29 | Disposition: A | Discharge status: Home / Self Care | Payer: Medicare HMO | Source: Ambulatory Visit | Attending: Obstetrics and Gynecology | Admitting: Obstetrics and Gynecology

## 2021-11-29 DIAGNOSIS — Z1231 Encounter for screening mammogram for malignant neoplasm of breast: Secondary | ICD-10-CM

## 2021-12-03 ENCOUNTER — Other Ambulatory Visit: Payer: Self-pay | Admitting: Obstetrics and Gynecology

## 2021-12-03 DIAGNOSIS — R928 Other abnormal and inconclusive findings on diagnostic imaging of breast: Secondary | ICD-10-CM

## 2021-12-11 ENCOUNTER — Other Ambulatory Visit: Payer: Self-pay | Admitting: Obstetrics and Gynecology

## 2021-12-11 ENCOUNTER — Ambulatory Visit
Admission: RE | Admit: 2021-12-11 | Discharge: 2021-12-11 | Disposition: A | Discharge status: Home / Self Care | Payer: Medicare HMO | Source: Ambulatory Visit | Attending: Obstetrics and Gynecology | Admitting: Obstetrics and Gynecology

## 2021-12-11 DIAGNOSIS — R928 Other abnormal and inconclusive findings on diagnostic imaging of breast: Secondary | ICD-10-CM

## 2021-12-11 DIAGNOSIS — R921 Mammographic calcification found on diagnostic imaging of breast: Secondary | ICD-10-CM

## 2022-05-12 ENCOUNTER — Other Ambulatory Visit (HOSPITAL_BASED_OUTPATIENT_CLINIC_OR_DEPARTMENT_OTHER): Payer: Self-pay

## 2022-05-12 MED ORDER — DAPAGLIFLOZIN PROPANEDIOL 10 MG PO TABS
10.0000 mg | ORAL_TABLET | Freq: Every day | ORAL | 3 refills | Status: AC
  Filled 2022-05-12: qty 90, 90d supply, fill #0

## 2022-05-12 MED ORDER — BYDUREON BCISE 2 MG/0.85ML ~~LOC~~ AUIJ
2.0000 mg | AUTO-INJECTOR | SUBCUTANEOUS | 3 refills | Status: AC
  Filled 2022-05-12: qty 10.2, 84d supply, fill #0

## 2022-05-13 ENCOUNTER — Other Ambulatory Visit (HOSPITAL_BASED_OUTPATIENT_CLINIC_OR_DEPARTMENT_OTHER): Payer: Self-pay

## 2022-05-16 ENCOUNTER — Other Ambulatory Visit (HOSPITAL_BASED_OUTPATIENT_CLINIC_OR_DEPARTMENT_OTHER): Payer: Self-pay

## 2022-06-16 ENCOUNTER — Ambulatory Visit
Admission: RE | Admit: 2022-06-16 | Discharge: 2022-06-16 | Disposition: A | Discharge status: Home / Self Care | Payer: Medicare HMO | Source: Ambulatory Visit | Attending: Obstetrics and Gynecology | Admitting: Obstetrics and Gynecology

## 2022-06-16 ENCOUNTER — Other Ambulatory Visit: Payer: Self-pay | Admitting: Obstetrics and Gynecology

## 2022-06-16 DIAGNOSIS — R921 Mammographic calcification found on diagnostic imaging of breast: Secondary | ICD-10-CM

## 2022-12-16 ENCOUNTER — Ambulatory Visit
Admission: RE | Admit: 2022-12-16 | Discharge: 2022-12-16 | Disposition: A | Discharge status: Home / Self Care | Payer: Medicare HMO | Source: Ambulatory Visit | Attending: Obstetrics and Gynecology | Admitting: Obstetrics and Gynecology

## 2022-12-16 DIAGNOSIS — R921 Mammographic calcification found on diagnostic imaging of breast: Secondary | ICD-10-CM

## 2023-11-25 ENCOUNTER — Other Ambulatory Visit: Payer: Self-pay | Admitting: Obstetrics and Gynecology

## 2023-11-25 DIAGNOSIS — R921 Mammographic calcification found on diagnostic imaging of breast: Secondary | ICD-10-CM

## 2023-12-18 ENCOUNTER — Ambulatory Visit
Admission: RE | Admit: 2023-12-18 | Discharge: 2023-12-18 | Disposition: A | Discharge status: Home / Self Care | Source: Ambulatory Visit | Attending: Obstetrics and Gynecology | Admitting: Obstetrics and Gynecology

## 2023-12-18 DIAGNOSIS — R921 Mammographic calcification found on diagnostic imaging of breast: Secondary | ICD-10-CM

## 2023-12-28 ENCOUNTER — Other Ambulatory Visit (HOSPITAL_BASED_OUTPATIENT_CLINIC_OR_DEPARTMENT_OTHER): Payer: Self-pay | Admitting: Obstetrics and Gynecology

## 2023-12-28 DIAGNOSIS — Z780 Asymptomatic menopausal state: Secondary | ICD-10-CM

## 2024-08-02 ENCOUNTER — Other Ambulatory Visit (HOSPITAL_BASED_OUTPATIENT_CLINIC_OR_DEPARTMENT_OTHER)
